# Patient Record
Sex: Male | Born: 1945 | Race: White | Hispanic: No | Marital: Married | Smoking: Never smoker
Health system: Southern US, Community
[De-identification: ages and names within clinical notes are randomized; demographics above are authoritative.]

## PROBLEM LIST (undated history)

## (undated) DIAGNOSIS — I10 Essential (primary) hypertension: Secondary | ICD-10-CM

## (undated) DIAGNOSIS — N183 Chronic kidney disease, stage 3 unspecified: Secondary | ICD-10-CM

## (undated) HISTORY — DX: Essential (primary) hypertension: I10

## (undated) HISTORY — DX: Chronic kidney disease, stage 3 unspecified: N18.30

---

## 2016-12-09 DIAGNOSIS — H903 Sensorineural hearing loss, bilateral: Secondary | ICD-10-CM | POA: Diagnosis not present

## 2016-12-09 DIAGNOSIS — H8102 Meniere's disease, left ear: Secondary | ICD-10-CM | POA: Diagnosis not present

## 2017-01-10 DIAGNOSIS — N183 Chronic kidney disease, stage 3 (moderate): Secondary | ICD-10-CM | POA: Diagnosis not present

## 2017-01-18 DIAGNOSIS — N2581 Secondary hyperparathyroidism of renal origin: Secondary | ICD-10-CM | POA: Diagnosis not present

## 2017-01-18 DIAGNOSIS — R809 Proteinuria, unspecified: Secondary | ICD-10-CM | POA: Diagnosis not present

## 2017-01-18 DIAGNOSIS — N183 Chronic kidney disease, stage 3 (moderate): Secondary | ICD-10-CM | POA: Diagnosis not present

## 2017-01-18 DIAGNOSIS — E870 Hyperosmolality and hypernatremia: Secondary | ICD-10-CM | POA: Diagnosis not present

## 2017-07-10 DIAGNOSIS — N183 Chronic kidney disease, stage 3 (moderate): Secondary | ICD-10-CM | POA: Diagnosis not present

## 2017-07-14 DIAGNOSIS — Z0000 Encounter for general adult medical examination without abnormal findings: Secondary | ICD-10-CM | POA: Diagnosis not present

## 2017-07-20 DIAGNOSIS — E756 Lipid storage disorder, unspecified: Secondary | ICD-10-CM | POA: Diagnosis not present

## 2017-07-20 DIAGNOSIS — Z0000 Encounter for general adult medical examination without abnormal findings: Secondary | ICD-10-CM | POA: Diagnosis not present

## 2017-07-20 DIAGNOSIS — Z125 Encounter for screening for malignant neoplasm of prostate: Secondary | ICD-10-CM | POA: Diagnosis not present

## 2017-07-24 DIAGNOSIS — H903 Sensorineural hearing loss, bilateral: Secondary | ICD-10-CM | POA: Diagnosis not present

## 2017-07-24 DIAGNOSIS — H8102 Meniere's disease, left ear: Secondary | ICD-10-CM | POA: Diagnosis not present

## 2017-07-31 DIAGNOSIS — N183 Chronic kidney disease, stage 3 (moderate): Secondary | ICD-10-CM | POA: Diagnosis not present

## 2017-07-31 DIAGNOSIS — R768 Other specified abnormal immunological findings in serum: Secondary | ICD-10-CM | POA: Diagnosis not present

## 2017-07-31 DIAGNOSIS — E211 Secondary hyperparathyroidism, not elsewhere classified: Secondary | ICD-10-CM | POA: Diagnosis not present

## 2017-07-31 DIAGNOSIS — I10 Essential (primary) hypertension: Secondary | ICD-10-CM | POA: Diagnosis not present

## 2017-07-31 DIAGNOSIS — R809 Proteinuria, unspecified: Secondary | ICD-10-CM | POA: Diagnosis not present

## 2017-09-19 DIAGNOSIS — I10 Essential (primary) hypertension: Secondary | ICD-10-CM | POA: Diagnosis not present

## 2017-09-19 DIAGNOSIS — Z23 Encounter for immunization: Secondary | ICD-10-CM | POA: Diagnosis not present

## 2017-12-12 DIAGNOSIS — H8102 Meniere's disease, left ear: Secondary | ICD-10-CM | POA: Diagnosis not present

## 2017-12-12 DIAGNOSIS — H903 Sensorineural hearing loss, bilateral: Secondary | ICD-10-CM | POA: Diagnosis not present

## 2017-12-21 DIAGNOSIS — N401 Enlarged prostate with lower urinary tract symptoms: Secondary | ICD-10-CM | POA: Diagnosis not present

## 2017-12-21 DIAGNOSIS — Z0000 Encounter for general adult medical examination without abnormal findings: Secondary | ICD-10-CM | POA: Diagnosis not present

## 2017-12-21 DIAGNOSIS — M109 Gout, unspecified: Secondary | ICD-10-CM | POA: Diagnosis not present

## 2017-12-21 DIAGNOSIS — I10 Essential (primary) hypertension: Secondary | ICD-10-CM | POA: Diagnosis not present

## 2017-12-21 DIAGNOSIS — N183 Chronic kidney disease, stage 3 (moderate): Secondary | ICD-10-CM | POA: Diagnosis not present

## 2017-12-21 DIAGNOSIS — H8109 Meniere's disease, unspecified ear: Secondary | ICD-10-CM | POA: Diagnosis not present

## 2018-01-08 DIAGNOSIS — I10 Essential (primary) hypertension: Secondary | ICD-10-CM | POA: Diagnosis not present

## 2018-01-09 DIAGNOSIS — E211 Secondary hyperparathyroidism, not elsewhere classified: Secondary | ICD-10-CM | POA: Diagnosis not present

## 2018-01-09 DIAGNOSIS — R809 Proteinuria, unspecified: Secondary | ICD-10-CM | POA: Diagnosis not present

## 2018-01-09 DIAGNOSIS — N183 Chronic kidney disease, stage 3 (moderate): Secondary | ICD-10-CM | POA: Diagnosis not present

## 2018-01-09 DIAGNOSIS — I10 Essential (primary) hypertension: Secondary | ICD-10-CM | POA: Diagnosis not present

## 2018-01-09 DIAGNOSIS — R768 Other specified abnormal immunological findings in serum: Secondary | ICD-10-CM | POA: Diagnosis not present

## 2018-01-11 DIAGNOSIS — H903 Sensorineural hearing loss, bilateral: Secondary | ICD-10-CM | POA: Diagnosis not present

## 2018-10-01 DIAGNOSIS — E785 Hyperlipidemia, unspecified: Secondary | ICD-10-CM | POA: Diagnosis not present

## 2018-10-01 DIAGNOSIS — M109 Gout, unspecified: Secondary | ICD-10-CM | POA: Diagnosis not present

## 2018-10-01 DIAGNOSIS — I10 Essential (primary) hypertension: Secondary | ICD-10-CM | POA: Diagnosis not present

## 2018-10-01 DIAGNOSIS — N401 Enlarged prostate with lower urinary tract symptoms: Secondary | ICD-10-CM | POA: Diagnosis not present

## 2018-10-01 DIAGNOSIS — N183 Chronic kidney disease, stage 3 (moderate): Secondary | ICD-10-CM | POA: Diagnosis not present

## 2018-10-01 DIAGNOSIS — Z23 Encounter for immunization: Secondary | ICD-10-CM | POA: Diagnosis not present

## 2018-10-01 DIAGNOSIS — N138 Other obstructive and reflux uropathy: Secondary | ICD-10-CM | POA: Diagnosis not present

## 2018-10-01 LAB — CBC AND DIFFERENTIAL
HCT: 47 (ref 41–53)
Hemoglobin: 16 (ref 13.5–17.5)
WBC: 7.7

## 2018-10-01 LAB — HEPATIC FUNCTION PANEL
ALT: 37 (ref 10–40)
AST: 27 (ref 14–40)
Alkaline Phosphatase: 61 (ref 25–125)
Bilirubin, Total: 0.8

## 2018-10-01 LAB — BASIC METABOLIC PANEL
BUN: 24 — AB (ref 4–21)
CO2: 27 — AB (ref 13–22)
Chloride: 101 (ref 99–108)
Creatinine: 1.3 (ref 0.6–1.3)
Glucose: 117
Potassium: 3.8 (ref 3.4–5.3)
Sodium: 138 (ref 137–147)

## 2018-10-01 LAB — CBC: RBC: 5.25 — AB (ref 3.87–5.11)

## 2018-10-01 LAB — LIPID PANEL
Cholesterol: 180 (ref 0–200)
LDL Cholesterol: 74
Triglycerides: 268 — AB (ref 40–160)

## 2018-10-01 LAB — COMPREHENSIVE METABOLIC PANEL
Albumin: 4.6 (ref 3.5–5.0)
Calcium: 9.8 (ref 8.7–10.7)
GFR calc Af Amer: 64
GFR calc non Af Amer: 53

## 2019-01-03 DIAGNOSIS — I10 Essential (primary) hypertension: Secondary | ICD-10-CM | POA: Diagnosis not present

## 2019-01-03 DIAGNOSIS — Z125 Encounter for screening for malignant neoplasm of prostate: Secondary | ICD-10-CM | POA: Diagnosis not present

## 2019-01-03 DIAGNOSIS — N401 Enlarged prostate with lower urinary tract symptoms: Secondary | ICD-10-CM | POA: Diagnosis not present

## 2019-01-03 DIAGNOSIS — E785 Hyperlipidemia, unspecified: Secondary | ICD-10-CM | POA: Diagnosis not present

## 2019-01-03 DIAGNOSIS — N183 Chronic kidney disease, stage 3 (moderate): Secondary | ICD-10-CM | POA: Diagnosis not present

## 2019-01-03 DIAGNOSIS — M109 Gout, unspecified: Secondary | ICD-10-CM | POA: Diagnosis not present

## 2019-01-03 LAB — CBC AND DIFFERENTIAL
HCT: 47 (ref 41–53)
Hemoglobin: 15.7 (ref 13.5–17.5)
Platelets: 137 — AB (ref 150–399)
WBC: 7.3

## 2019-01-03 LAB — CBC: RBC: 5.26 — AB (ref 3.87–5.11)

## 2019-01-04 LAB — BASIC METABOLIC PANEL
BUN: 28 — AB (ref 4–21)
CO2: 32 — AB (ref 13–22)
Chloride: 101 (ref 99–108)
Creatinine: 1.4 — AB (ref 0.6–1.3)
Glucose: 90
Potassium: 4.4 (ref 3.4–5.3)
Sodium: 142 (ref 137–147)

## 2019-01-04 LAB — LIPID PANEL
Cholesterol: 158 (ref 0–200)
HDL: 106 — AB (ref 35–70)
LDL Cholesterol: 74
Triglycerides: 158 (ref 40–160)

## 2019-01-04 LAB — HEPATIC FUNCTION PANEL
ALT: 42 — AB (ref 10–40)
AST: 28 (ref 14–40)
Alkaline Phosphatase: 59 (ref 25–125)
Bilirubin, Total: 0.6

## 2019-01-04 LAB — COMPREHENSIVE METABOLIC PANEL
Albumin: 4.5 (ref 3.5–5.0)
Calcium: 9.7 (ref 8.7–10.7)
GFR calc Af Amer: 59
GFR calc non Af Amer: 49

## 2019-01-15 DIAGNOSIS — Z0000 Encounter for general adult medical examination without abnormal findings: Secondary | ICD-10-CM | POA: Diagnosis not present

## 2019-01-15 DIAGNOSIS — Z1389 Encounter for screening for other disorder: Secondary | ICD-10-CM | POA: Diagnosis not present

## 2019-04-01 DIAGNOSIS — M109 Gout, unspecified: Secondary | ICD-10-CM | POA: Diagnosis not present

## 2019-04-01 DIAGNOSIS — N400 Enlarged prostate without lower urinary tract symptoms: Secondary | ICD-10-CM | POA: Diagnosis not present

## 2019-04-01 DIAGNOSIS — N183 Chronic kidney disease, stage 3 (moderate): Secondary | ICD-10-CM | POA: Diagnosis not present

## 2019-04-01 DIAGNOSIS — I129 Hypertensive chronic kidney disease with stage 1 through stage 4 chronic kidney disease, or unspecified chronic kidney disease: Secondary | ICD-10-CM | POA: Diagnosis not present

## 2019-07-04 DIAGNOSIS — H903 Sensorineural hearing loss, bilateral: Secondary | ICD-10-CM | POA: Diagnosis not present

## 2019-07-04 DIAGNOSIS — D126 Benign neoplasm of colon, unspecified: Secondary | ICD-10-CM | POA: Diagnosis not present

## 2019-07-04 DIAGNOSIS — N183 Chronic kidney disease, stage 3 (moderate): Secondary | ICD-10-CM | POA: Diagnosis not present

## 2019-07-04 DIAGNOSIS — I10 Essential (primary) hypertension: Secondary | ICD-10-CM | POA: Diagnosis not present

## 2019-08-19 DIAGNOSIS — Z23 Encounter for immunization: Secondary | ICD-10-CM | POA: Diagnosis not present

## 2019-09-16 DIAGNOSIS — Z1159 Encounter for screening for other viral diseases: Secondary | ICD-10-CM | POA: Diagnosis not present

## 2019-09-19 DIAGNOSIS — D122 Benign neoplasm of ascending colon: Secondary | ICD-10-CM | POA: Diagnosis not present

## 2019-09-19 DIAGNOSIS — D123 Benign neoplasm of transverse colon: Secondary | ICD-10-CM | POA: Diagnosis not present

## 2019-09-19 DIAGNOSIS — Z86010 Personal history of colonic polyps: Secondary | ICD-10-CM | POA: Diagnosis not present

## 2019-09-24 DIAGNOSIS — D123 Benign neoplasm of transverse colon: Secondary | ICD-10-CM | POA: Diagnosis not present

## 2019-09-24 DIAGNOSIS — D122 Benign neoplasm of ascending colon: Secondary | ICD-10-CM | POA: Diagnosis not present

## 2019-12-24 DIAGNOSIS — N183 Chronic kidney disease, stage 3 unspecified: Secondary | ICD-10-CM | POA: Diagnosis not present

## 2019-12-29 ENCOUNTER — Ambulatory Visit: Payer: PRIVATE HEALTH INSURANCE | Attending: Internal Medicine

## 2019-12-29 DIAGNOSIS — Z23 Encounter for immunization: Secondary | ICD-10-CM | POA: Insufficient documentation

## 2019-12-29 NOTE — Progress Notes (Signed)
   Covid-19 Vaccination Clinic  Name:  Andre [name]    MRN: [id number] DOB: 1946-07-13  12/29/2019  Andre [name] was observed post Covid-19 immunization for 15 minutes without incidence. He was provided with Vaccine Information Sheet and instruction to access the V-Safe system.   Andre [name] was instructed to call 911 with any severe reactions post vaccine: Marland Kitchen Difficulty breathing  . Swelling of your face and throat  . A fast heartbeat  . A bad rash all over your body  . Dizziness and weakness    Immunizations Administered    Name Date Dose VIS Date Route   Pfizer COVID-19 Vaccine 12/29/2019 12:51 PM 0.3 mL 11/15/2019 Intramuscular   Manufacturer: [place]   Lot: [id number]   [place]: [id number]

## 2019-12-31 DIAGNOSIS — M109 Gout, unspecified: Secondary | ICD-10-CM | POA: Diagnosis not present

## 2019-12-31 DIAGNOSIS — R809 Proteinuria, unspecified: Secondary | ICD-10-CM | POA: Diagnosis not present

## 2019-12-31 DIAGNOSIS — I129 Hypertensive chronic kidney disease with stage 1 through stage 4 chronic kidney disease, or unspecified chronic kidney disease: Secondary | ICD-10-CM | POA: Diagnosis not present

## 2019-12-31 DIAGNOSIS — N183 Chronic kidney disease, stage 3 unspecified: Secondary | ICD-10-CM | POA: Diagnosis not present

## 2019-12-31 DIAGNOSIS — N400 Enlarged prostate without lower urinary tract symptoms: Secondary | ICD-10-CM | POA: Diagnosis not present

## 2020-01-05 ENCOUNTER — Ambulatory Visit: Payer: Self-pay

## 2020-01-10 ENCOUNTER — Ambulatory Visit: Payer: Self-pay

## 2020-01-17 DIAGNOSIS — H903 Sensorineural hearing loss, bilateral: Secondary | ICD-10-CM | POA: Diagnosis not present

## 2020-01-17 DIAGNOSIS — Z86010 Personal history of colonic polyps: Secondary | ICD-10-CM | POA: Diagnosis not present

## 2020-01-17 DIAGNOSIS — Z0000 Encounter for general adult medical examination without abnormal findings: Secondary | ICD-10-CM | POA: Diagnosis not present

## 2020-01-17 DIAGNOSIS — M109 Gout, unspecified: Secondary | ICD-10-CM | POA: Diagnosis not present

## 2020-01-17 DIAGNOSIS — Z1389 Encounter for screening for other disorder: Secondary | ICD-10-CM | POA: Diagnosis not present

## 2020-01-17 DIAGNOSIS — E785 Hyperlipidemia, unspecified: Secondary | ICD-10-CM | POA: Diagnosis not present

## 2020-01-17 DIAGNOSIS — Z125 Encounter for screening for malignant neoplasm of prostate: Secondary | ICD-10-CM | POA: Diagnosis not present

## 2020-01-17 DIAGNOSIS — N183 Chronic kidney disease, stage 3 unspecified: Secondary | ICD-10-CM | POA: Diagnosis not present

## 2020-01-17 DIAGNOSIS — I10 Essential (primary) hypertension: Secondary | ICD-10-CM | POA: Diagnosis not present

## 2020-01-17 LAB — LIPID PANEL
Cholesterol: 149 (ref 0–200)
HDL: 100 — AB (ref 35–70)
LDL Cholesterol: 75
Triglycerides: 143 (ref 40–160)

## 2020-01-17 LAB — CBC: RBC: 5.36 — AB (ref 3.87–5.11)

## 2020-01-17 LAB — CBC AND DIFFERENTIAL
HCT: 48 (ref 41–53)
Hemoglobin: 16 (ref 13.5–17.5)
Platelets: 157 (ref 150–399)
WBC: 7.4

## 2020-01-20 ENCOUNTER — Ambulatory Visit: Payer: PRIVATE HEALTH INSURANCE | Attending: Internal Medicine

## 2020-01-20 DIAGNOSIS — Z23 Encounter for immunization: Secondary | ICD-10-CM

## 2020-01-20 NOTE — Progress Notes (Signed)
   Covid-19 Vaccination Clinic  Name:  Andre [name]    MRN: [id number] DOB: 21-Jul-1946  01/20/2020  Mr. Ragucci was observed post Covid-19 immunization for 15 minutes without incidence. He was provided with Vaccine Information Sheet and instruction to access the V-Safe system.   Mr. Tschirhart was instructed to call 911 with any severe reactions post vaccine: Marland Kitchen Difficulty breathing  . Swelling of your face and throat  . A fast heartbeat  . A bad rash all over your body  . Dizziness and weakness    Immunizations Administered    Name Date Dose VIS Date Route   Pfizer COVID-19 Vaccine 01/20/2020  8:36 AM 0.3 mL 11/15/2019 Intramuscular   Manufacturer: [place]   Lot: [id number]   [place]: [id number]

## 2020-05-08 DIAGNOSIS — M109 Gout, unspecified: Secondary | ICD-10-CM | POA: Diagnosis not present

## 2020-05-08 DIAGNOSIS — N183 Chronic kidney disease, stage 3 unspecified: Secondary | ICD-10-CM | POA: Diagnosis not present

## 2020-07-15 DIAGNOSIS — I10 Essential (primary) hypertension: Secondary | ICD-10-CM | POA: Diagnosis not present

## 2020-07-15 DIAGNOSIS — N183 Chronic kidney disease, stage 3 unspecified: Secondary | ICD-10-CM | POA: Diagnosis not present

## 2020-07-15 DIAGNOSIS — M109 Gout, unspecified: Secondary | ICD-10-CM | POA: Diagnosis not present

## 2020-09-03 ENCOUNTER — Ambulatory Visit: Payer: Medicare Other | Attending: Internal Medicine

## 2020-09-03 DIAGNOSIS — Z23 Encounter for immunization: Secondary | ICD-10-CM

## 2020-09-03 NOTE — Progress Notes (Signed)
   Covid-19 Vaccination Clinic  Name:  Katie Moch    MRN: [id number] DOB: 1946-06-21  09/03/2020  Mr. Rakestraw was observed post Covid-19 immunization for 15 minutes without incident. He was provided with Vaccine Information Sheet and instruction to access the V-Safe system.   Mr. Luten was instructed to call 911 with any severe reactions post vaccine: Marland Kitchen Difficulty breathing  . Swelling of face and throat  . A fast heartbeat  . A bad rash all over body  . Dizziness and weakness

## 2020-09-14 DIAGNOSIS — Z23 Encounter for immunization: Secondary | ICD-10-CM | POA: Diagnosis not present

## 2020-09-30 ENCOUNTER — Ambulatory Visit: Payer: PRIVATE HEALTH INSURANCE

## 2020-10-26 ENCOUNTER — Ambulatory Visit (INDEPENDENT_AMBULATORY_CARE_PROVIDER_SITE_OTHER): Payer: Medicare Other | Admitting: Internal Medicine

## 2020-10-26 ENCOUNTER — Other Ambulatory Visit: Payer: Self-pay

## 2020-10-26 ENCOUNTER — Encounter: Payer: Self-pay | Admitting: Internal Medicine

## 2020-10-26 VITALS — BP 118/62 | HR 86 | Temp 98.0°F | Ht 68.5 in | Wt 201.8 lb

## 2020-10-26 DIAGNOSIS — N138 Other obstructive and reflux uropathy: Secondary | ICD-10-CM | POA: Diagnosis not present

## 2020-10-26 DIAGNOSIS — Z1159 Encounter for screening for other viral diseases: Secondary | ICD-10-CM

## 2020-10-26 DIAGNOSIS — H8109 Meniere's disease, unspecified ear: Secondary | ICD-10-CM

## 2020-10-26 DIAGNOSIS — E785 Hyperlipidemia, unspecified: Secondary | ICD-10-CM | POA: Diagnosis not present

## 2020-10-26 DIAGNOSIS — I10 Essential (primary) hypertension: Secondary | ICD-10-CM | POA: Diagnosis not present

## 2020-10-26 DIAGNOSIS — N183 Chronic kidney disease, stage 3 unspecified: Secondary | ICD-10-CM | POA: Insufficient documentation

## 2020-10-26 DIAGNOSIS — M109 Gout, unspecified: Secondary | ICD-10-CM | POA: Insufficient documentation

## 2020-10-26 DIAGNOSIS — D126 Benign neoplasm of colon, unspecified: Secondary | ICD-10-CM | POA: Diagnosis not present

## 2020-10-26 DIAGNOSIS — N401 Enlarged prostate with lower urinary tract symptoms: Secondary | ICD-10-CM | POA: Insufficient documentation

## 2020-10-26 MED ORDER — INDOMETHACIN 50 MG PO CAPS: 50.0000 mg | ORAL_CAPSULE | Freq: Once | ORAL | 0 refills | Status: DC | PRN

## 2020-10-26 NOTE — Progress Notes (Signed)
Provider:  Rexene Edison. Mariea Clonts, D.O., C.M.D. Location:   [facility]   Place of Service:    clinic Previous PCP: Gayland Curry, DO Patient Care Team: Gayland Curry, DO as PCP - General (Geriatric Medicine)  Extended Emergency Contact Information Primary Emergency Contact: Jerrald, [place] Mobile Phone: [phone] Relation: Spouse Secondary Emergency Contact: Trena Platt Mobile Phone: [phone] Relation: Daughter  Code Status: DNR Goals of Care: Advanced Directive information Advanced Directives 10/26/2020  Does Patient Have a Medical Advance Directive? Yes  Type of Advance Directive Out of facility DNR (pink MOST or yellow form)  Does patient want to make changes to medical advance directive? No - Patient declined      Chief Complaint  Patient presents with  . Establish Care    New patient to establish care   . Health Maintenance    Hepatitis C screening, PNA patient states he really does not know but maybe 5 years ago and Tetanus/ T Dap   . Acute Visit    Discuss gout medication     HPI: Patient is a 74 y.o. male seen today to establish with [facility].  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  Speech recognition is challenging. Had been followed by a cochlear implant specialist at [facility]  At that time, he was not a candidate.   His hearing is a problem for him and a bigger one for his wife.  Got new hearing aids b/c those installed at [facility] were ineffective.  New ones were from [employer].    They have a cat, as well.  Burmese dark champagne color.  He's from [facility].  His wife established here a few weeks ago.    He's being followed for kidney disease--Dr. Royce Macadamia at [facility] (due early next year to go again).    He's had gout attacks.  It's affected by his diet.  Seems to get worse if he eats too much bread.  A glass of wine or scotch does ok, but if not in moderation, he has trouble.  He  had been on indomethacin, but his [facility] physician advised against it.  He has prn prednisone in the event of a flare-up.  indomethacin would knock it out right away if he had a flare.    He's used a small amount of ibuprofen for aches here and there.    He had BPH with LUTS.  He gets up just once at night.  Slight delay in stream, but nothing serious.    He has been taking the pravastatin.  LDL was good when he had it checked in Feb.    He had three tubular adenomas--Dr. Senaida Ores had said another one in 5 yrs.    Sees well.  Has not had an eye test in a long time.   His wife wanted him to get the shingles shot and he had that.  He thinks he had a pneumonia shot then.   It's been years since tetanus vaccine.    He seems to have developed a little bit of allergies.    He does walking and they did a lot of gardening at their old home in [place] before moving to [facility].    He has a living will, hcpoa and DNR.  Past Medical History:  Diagnosis Date  . CKD (chronic kidney disease), stage III ([place])   . HTN (hypertension)    History reviewed. No pertinent surgical history.  Social History   Socioeconomic History  . Marital status: Married    Spouse name: Not on file  . Number of children: Not on file  . Years of education: Not on file  . Highest education level: Master's degree (e.g., MA, MS, MEng, MEd, MSW, MBA)  Occupational History  . Not on file  Tobacco Use  . Smoking status: Never Smoker  . Smokeless tobacco: Never Used  Substance and Sexual Activity  . Alcohol use: Yes    Comment: Rarely   . Drug use: Never  . Sexual activity: Yes  Other Topics Concern  . Not on file  Social History Narrative   Diet: Regular      Do you drink/ eat things with caffeine?  Diet Soda / Coffee      Marital status:    Married                           What year were you married ? 1970      Do you live in a house, apartment,assistred living, condo, trailer, etc.)?  [facility] /  Independent      Is it one or more stories? 1      How many persons live in your home ? 2      Do you have any pets in your home ?(please list)  1 Cat      Highest Level of education completed:  MBA      Current or past profession:  Geneticist, molecular, Contractor / [place]      Do you exercise? Yes                             Type & how often  Walk / Swim      ADVANCED DIRECTIVES (Please bring copies)      Do you have a living will?  Yes      Do you have a DNR form?     Yes                  If not, do you want to discuss one?       Do you have signed POA?HPOA forms?    Yes             If so, please bring to your appointment      FUNCTIONAL STATUS- To be completed by Spouse / child / Staff       Do you have difficulty bathing or dressing yourself ? No      Do you have difficulty preparing food or eating ? No      Do you have difficulty managing your mediation ? No      Do you have difficulty managing your finances ? No      Do you have difficulty affording your medication ? No      Social Determinants of Health   Financial Resource Strain:   . Difficulty of Paying Living Expenses: Not on file  Food Insecurity:   . Worried About Charity fundraiser in the Last Year: Not on file  . Ran Out of Food in the Last Year: Not on file  Transportation Needs:   . Lack of Transportation (Medical): Not on file  . Lack of Transportation (Non-Medical): Not on file  Physical Activity:   . Days of Exercise per Week: Not on file  . Minutes  of Exercise per Session: Not on file  Stress:   . Feeling of Stress : Not on file  Social Connections:   . Frequency of Communication with Friends and Family: Not on file  . Frequency of Social Gatherings with Friends and Family: Not on file  . Attends Religious Services: Not on file  . Active Member of Clubs or Organizations: Not on file  . Attends Archivist Meetings: Not on file  . Marital Status: Not on file    reports  that he has never smoked. He has never used smokeless tobacco. He reports current alcohol use. He reports that he does not use drugs.  Functional Status Survey:    Family History  Problem Relation Age of Onset  . Stroke Mother 72  . Bronchiolitis Father 73    Health Maintenance  Topic Date Due  . Hepatitis C Screening  Never done  . TETANUS/TDAP  Never done  . PNA vac Low Risk Adult (1 of 2 - PCV13) Never done  . COLONOSCOPY  01/05/2029  . INFLUENZA VACCINE  Completed  . COVID-19 Vaccine  Completed    Allergies  Allergen Reactions  . Colchicine     Upset stomach     Outpatient Encounter Medications as of 10/26/2020  Medication Sig  . allopurinol (ZYLOPRIM) 100 MG tablet Take 100 mg by mouth daily.  . chlorthalidone (HYGROTON) 25 MG tablet Take 12.5 mg by mouth daily.  Marland Kitchen FLUTICASONE PROPIONATE NA Place into the nose daily.  . irbesartan (AVAPRO) 75 MG tablet Take 75 mg by mouth daily.  . pravastatin (PRAVACHOL) 20 MG tablet Take 20 mg by mouth daily.  . predniSONE (DELTASONE) 10 MG tablet Take 10 mg by mouth as needed.  . tamsulosin (FLOMAX) 0.4 MG CAPS capsule Take 0.4 mg by mouth at bedtime.  . Vitamin D, Ergocalciferol, 50 MCG (2000 UT) CAPS Take by mouth daily.   No facility-administered encounter medications on file as of 10/26/2020.    Review of Systems  Constitutional: Negative for chills, fever and malaise/fatigue.  HENT: Positive for hearing loss.   Eyes: Negative for blurred vision.       No recent eye exam--recommended one  Respiratory: Negative for cough and shortness of breath.   Cardiovascular: Negative for chest pain, palpitations and leg swelling.  Gastrointestinal: Negative for abdominal pain, blood in stool, constipation, diarrhea, heartburn and melena.  Genitourinary: Negative for dysuria.  Musculoskeletal: Positive for joint pain. Negative for back pain, falls, myalgias and neck pain.       Pain in feet  Skin: Negative for itching and rash.   Neurological: Negative for dizziness, loss of consciousness and weakness.  Endo/Heme/Allergies: Positive for environmental allergies. Does not bruise/bleed easily.  Psychiatric/Behavioral: Negative for depression and memory loss. The patient is not nervous/anxious and does not have insomnia.     Vitals:   10/26/20 1328  BP: 118/62  Pulse: 86  Temp: 98 F (36.7 C)  TempSrc: Temporal  SpO2: 96%  Weight: 201 lb 12.8 oz (91.5 kg)  Height: 5' 8.5" (1.74 m)   Body mass index is 30.23 kg/m. Physical Exam Vitals reviewed.  Constitutional:      General: He is not in acute distress.    Appearance: Normal appearance. He is not ill-appearing or toxic-appearing.  HENT:     Head: Normocephalic and atraumatic.     Right Ear: Tympanic membrane, ear canal and external ear normal.     Left Ear: Tympanic membrane, ear canal and external ear  normal.     Ears:     Comments: HOH, right hearing aid    Nose: No congestion.     Mouth/Throat:     Pharynx: Oropharynx is clear.  Eyes:     Extraocular Movements: Extraocular movements intact.     Conjunctiva/sclera: Conjunctivae normal.     Pupils: Pupils are equal, round, and reactive to light.     Comments: Arcus senilensis  Cardiovascular:     Rate and Rhythm: Normal rate and regular rhythm.     Pulses: Normal pulses.     Heart sounds: Normal heart sounds.  Pulmonary:     Effort: Pulmonary effort is normal.     Breath sounds: Normal breath sounds. No wheezing, rhonchi or rales.  Abdominal:     General: Bowel sounds are normal. There is no distension.     Palpations: Abdomen is soft.     Tenderness: There is no abdominal tenderness. There is no guarding or rebound.  Musculoskeletal:        General: Normal range of motion.     Cervical back: Neck supple.     Right lower leg: No edema.     Left lower leg: No edema.  Lymphadenopathy:     Cervical: No cervical adenopathy.  Skin:    General: Skin is warm and dry.     Capillary Refill:  Capillary refill takes less than 2 seconds.  Neurological:     General: No focal deficit present.     Mental Status: He is alert and oriented to person, place, and time.     Cranial Nerves: No cranial nerve deficit.     Sensory: No sensory deficit.     Motor: No weakness.     Coordination: Coordination normal.     Gait: Gait normal.     Deep Tendon Reflexes: Reflexes normal.  Psychiatric:        Mood and Affect: Mood normal.        Behavior: Behavior normal.        Thought Content: Thought content normal.        Judgment: Judgment normal.     Labs reviewed: Basic Metabolic Panel: No results for input(s): NA, K, CL, CO2, GLUCOSE, BUN, CREATININE, CALCIUM, MG, PHOS in the last 8760 hours. Liver Function Tests: No results for input(s): AST, ALT, ALKPHOS, BILITOT, PROT, ALBUMIN in the last 8760 hours. No results for input(s): LIPASE, AMYLASE in the last 8760 hours. No results for input(s): AMMONIA in the last 8760 hours. CBC: Recent Labs    01/17/20 0000  WBC 7.4  HGB 16.0  HCT 48  PLT 157   Cardiac Enzymes: No results for input(s): CKTOTAL, CKMB, CKMBINDEX, TROPONINI in the last 8760 hours. BNP: Invalid input(s): POCBNP No results found for: HGBA1C No results found for: TSH No results found for: VITAMINB12 No results found for: FOLATE No results found for: IRON, TIBC, FERRITIN  Imaging and Procedures noted on new patient packet: None included  Assessment/Plan 1. Meniere's disease, unspecified laterality - does not have any vertigo, dizziness, only hearing loss and comprehension, tinnitus - refer to new audiologist here--has not been since he was in [place] and saw Dr at [facility] - Ambulatory referral to Audiology  2. Tubular adenoma of colon -next due for cscope in 2023--he really does not want to do one of those again  3. Gouty arthritis - check uric acid - discussed his acute gout tx--he does not want to take prednisone and can usually get rid of a  flare with a single  indomethacin tablet - Basic metabolic panel; Future - Uric Acid; Future  4. Benign essential HTN -bp at goal, no change needed  5. Stage 3 chronic kidney disease, unspecified whether stage 3a or 3b CKD ([place]) -cont to follow with [facility] - Avoid nephrotoxic agents like nsaids, dose adjust renally excreted meds, hydrate--overall - CBC with Differential/Platelet; Future - Lipid panel; Future - Basic metabolic panel; Future - Hepatic function panel; Future - Hepatitis C antibody; Future  6. BPH with obstruction/lower urinary tract symptoms -mild symptoms, monitor  7. Hyperlipidemia, unspecified hyperlipidemia type -lipids were good with LDL 75 back in feb on pravachol so continue this - Lipid panel; Future  8. Encounter for hepatitis C screening test for low risk patient - Hepatitis C antibody; Future  Labs/tests ordered:   Lab Orders     CBC with Differential/Platelet     Lipid panel     Basic metabolic panel     Hepatic function panel     Hepatitis C antibody     Uric Acid All in AM here  F/u in 6 mos for med mgt Needs vaccine records from [facility] b/c shingles and pneumonia vaccines were not in [facility] records.  Fredy Gladu L. Mahayla Haddaway, D.O. [facility] [address], [place] [address] Cell Phone (Mon-Fri 8am-5pm):  [phone] On Call:  [phone] & follow prompts after 5pm & weekends Office Phone:  [phone] Office Fax:  [phone]

## 2020-10-27 ENCOUNTER — Other Ambulatory Visit: Payer: Medicare Other

## 2020-10-27 DIAGNOSIS — N183 Chronic kidney disease, stage 3 unspecified: Secondary | ICD-10-CM

## 2020-10-27 DIAGNOSIS — Z1159 Encounter for screening for other viral diseases: Secondary | ICD-10-CM | POA: Diagnosis not present

## 2020-10-27 DIAGNOSIS — E785 Hyperlipidemia, unspecified: Secondary | ICD-10-CM

## 2020-10-27 DIAGNOSIS — M109 Gout, unspecified: Secondary | ICD-10-CM | POA: Diagnosis not present

## 2020-10-28 NOTE — Progress Notes (Signed)
For some reason, I was thinking he was not on prevention medication for his gout when we met.  He clearly is on allopurinol.  This dose is not getting him to goal so perhaps we should try uloric 58m daily instead.  Hopefully, it will be covered by his insurance.  It's a newer agent with better results at lowering uric acid.  This is not urgent and can be addressed after the holiday.

## 2020-10-28 NOTE — Progress Notes (Signed)
Blood counts are normal (not anemic) Liver tests are normal Kidney function is above the same as last time but he appears better hydrated. Cholesterol is at goal Uric acid is elevated--ideally, to prevent gout flares, the level should be kept under 6.  I recommend he begin allopurinol 100mg  po daily for this.   Hepatitis C screen is pending

## 2020-10-30 LAB — HEPATIC FUNCTION PANEL
AG Ratio: 1.7 (calc) (ref 1.0–2.5)
ALT: 35 U/L (ref 9–46)
AST: 21 U/L (ref 10–35)
Albumin: 4.2 g/dL (ref 3.6–5.1)
Alkaline phosphatase (APISO): 80 U/L (ref 35–144)
Bilirubin, Direct: 0.2 mg/dL (ref 0.0–0.2)
Globulin: 2.5 g/dL (calc) (ref 1.9–3.7)
Indirect Bilirubin: 0.6 mg/dL (calc) (ref 0.2–1.2)
Total Bilirubin: 0.8 mg/dL (ref 0.2–1.2)
Total Protein: 6.7 g/dL (ref 6.1–8.1)

## 2020-10-30 LAB — LIPID PANEL
Cholesterol: 162 mg/dL (ref <200)
HDL: 53 mg/dL (ref >=40)
LDL Cholesterol (Calc): 86 mg/dL (calc)
Non-HDL Cholesterol (Calc): 109 mg/dL (calc) (ref <130)
Total CHOL/HDL Ratio: 3.1 (calc) (ref <5.0)
Triglycerides: 129 mg/dL (ref <150)

## 2020-10-30 LAB — BASIC METABOLIC PANEL
BUN/Creatinine Ratio: 14 (calc) (ref 6–22)
BUN: 20 mg/dL (ref 7–25)
CO2: 30 mmol/L (ref 20–32)
Calcium: 9.5 mg/dL (ref 8.6–10.3)
Chloride: 103 mmol/L (ref 98–110)
Creat: 1.44 mg/dL — ABNORMAL HIGH (ref 0.70–1.18)
Glucose, Bld: 102 mg/dL — ABNORMAL HIGH (ref 65–99)
Potassium: 4.6 mmol/L (ref 3.5–5.3)
Sodium: 141 mmol/L (ref 135–146)

## 2020-10-30 LAB — CBC WITH DIFFERENTIAL/PLATELET
Absolute Monocytes: 438 cells/uL (ref 200–950)
Basophils Absolute: 30 cells/uL (ref 0–200)
Basophils Relative: 0.5 %
Eosinophils Absolute: 456 cells/uL (ref 15–500)
Eosinophils Relative: 7.6 %
HCT: 49.3 % (ref 38.5–50.0)
Hemoglobin: 16.3 g/dL (ref 13.2–17.1)
Lymphs Abs: 1458 cells/uL (ref 850–3900)
MCH: 29.3 pg (ref 27.0–33.0)
MCHC: 33.1 g/dL (ref 32.0–36.0)
MCV: 88.7 fL (ref 80.0–100.0)
MPV: 11.8 fL (ref 7.5–12.5)
Monocytes Relative: 7.3 %
Neutro Abs: 3618 cells/uL (ref 1500–7800)
Neutrophils Relative %: 60.3 %
Platelets: 162 10*3/uL (ref 140–400)
RBC: 5.56 10*6/uL (ref 4.20–5.80)
RDW: 12.6 % (ref 11.0–15.0)
Total Lymphocyte: 24.3 %
WBC: 6 10*3/uL (ref 3.8–10.8)

## 2020-10-30 LAB — URIC ACID: Uric Acid, Serum: 7.6 mg/dL (ref 4.0–8.0)

## 2020-10-30 LAB — HEPATITIS C ANTIBODY
Hepatitis C Ab: NONREACTIVE
SIGNAL TO CUT-OFF: 0.01 (ref <1.00)

## 2020-11-03 ENCOUNTER — Telehealth: Payer: Self-pay

## 2020-11-03 ENCOUNTER — Other Ambulatory Visit: Payer: Self-pay

## 2020-11-03 MED ORDER — FEBUXOSTAT 40 MG PO TABS
40.0000 mg | ORAL_TABLET | Freq: Every day | ORAL | 1 refills | Status: DC
Start: 2020-11-03 — End: 2020-11-23

## 2020-11-06 DIAGNOSIS — H903 Sensorineural hearing loss, bilateral: Secondary | ICD-10-CM | POA: Diagnosis not present

## 2020-11-23 ENCOUNTER — Other Ambulatory Visit: Payer: Self-pay | Admitting: *Deleted

## 2020-11-23 MED ORDER — FEBUXOSTAT 40 MG PO TABS
40.0000 mg | ORAL_TABLET | Freq: Every day | ORAL | 1 refills | Status: DC
Start: 2020-11-23 — End: 2020-12-30

## 2020-11-23 NOTE — Telephone Encounter (Signed)
Received refill Request from [facility].

## 2020-12-01 DIAGNOSIS — N183 Chronic kidney disease, stage 3 unspecified: Secondary | ICD-10-CM | POA: Diagnosis not present

## 2020-12-11 DIAGNOSIS — M109 Gout, unspecified: Secondary | ICD-10-CM | POA: Diagnosis not present

## 2020-12-11 DIAGNOSIS — N1831 Chronic kidney disease, stage 3a: Secondary | ICD-10-CM | POA: Diagnosis not present

## 2020-12-11 DIAGNOSIS — N400 Enlarged prostate without lower urinary tract symptoms: Secondary | ICD-10-CM | POA: Diagnosis not present

## 2020-12-11 DIAGNOSIS — I129 Hypertensive chronic kidney disease with stage 1 through stage 4 chronic kidney disease, or unspecified chronic kidney disease: Secondary | ICD-10-CM | POA: Diagnosis not present

## 2020-12-11 DIAGNOSIS — R809 Proteinuria, unspecified: Secondary | ICD-10-CM | POA: Diagnosis not present

## 2020-12-30 ENCOUNTER — Ambulatory Visit (INDEPENDENT_AMBULATORY_CARE_PROVIDER_SITE_OTHER): Payer: Medicare Other | Admitting: Family

## 2020-12-30 ENCOUNTER — Other Ambulatory Visit: Payer: Self-pay

## 2020-12-30 ENCOUNTER — Encounter: Payer: Self-pay | Admitting: Family

## 2020-12-30 VITALS — BP 100/60 | HR 66 | Temp 96.8°F | Resp 16 | Ht 68.5 in | Wt 207.4 lb

## 2020-12-30 DIAGNOSIS — M109 Gout, unspecified: Secondary | ICD-10-CM | POA: Diagnosis not present

## 2020-12-30 DIAGNOSIS — R197 Diarrhea, unspecified: Secondary | ICD-10-CM

## 2020-12-30 MED ORDER — ALLOPURINOL 100 MG PO TABS: 100.0000 mg | ORAL_TABLET | Freq: Every day | ORAL | 0 refills | Status: DC

## 2020-12-30 NOTE — Progress Notes (Signed)
Provider: Hoang Pettingill FNP-C  Gayland Curry, DO  Patient Care Team: Gayland Curry, DO as PCP - General (Geriatric Medicine)  Extended Emergency Contact Information Primary Emergency Contact: Wendie Agreste Address: [place], [place] [address] Johnnette Litter of [place] Phone: [phone] Relation: Spouse Secondary Emergency Contact: Reinaldo Raddle [place] of [place] Mobile Phone: [phone] Relation: Daughter  Code Status:  Full Code  Goals of care: Advanced Directive information Advanced Directives 12/30/2020  Does Patient Have a Medical Advance Directive? No  Type of Advance Directive -  Does patient want to make changes to medical advance directive? -  Would patient like information on creating a medical advance directive? No - Patient declined     Chief Complaint  Patient presents with  . Acute Visit    Reaction to medication change.    HPI:  Pt is a 75 y.o. male seen today for an acute visit for evaluation of reaction to medication.Dr.Reed switching Allopurinol to uloric started having diarrhea for the past week since starting or Urolic.Diarrhea is described as loose stool.Has had increase flatulence too.No bloating,cramping or abdominal pain.Has not not had diarrhea for many years until he started on new medication.left small toe tends to get red when he has gout.Not sure too whether shoes are getting tight. He is careful with his diet and drink wine.   Past Medical History:  Diagnosis Date  . CKD (chronic kidney disease), stage III ([place])   . HTN (hypertension)    History reviewed. No pertinent surgical history.  Allergies  Allergen Reactions  . Colchicine     Upset stomach     Outpatient Encounter Medications as of 12/30/2020  Medication Sig  . chlorthalidone (HYGROTON) 25 MG tablet Take 12.5 mg by mouth daily.  . febuxostat (ULORIC) 40 MG tablet Take 1 tablet (40 mg total) by mouth daily.  Marland Kitchen FLUTICASONE  PROPIONATE NA Place into the nose daily.  . indomethacin (INDOCIN) 50 MG capsule Take 1 capsule (50 mg total) by mouth once as needed for up to 1 dose (gout flare).  . irbesartan (AVAPRO) 75 MG tablet Take 75 mg by mouth daily.  . pravastatin (PRAVACHOL) 20 MG tablet Take 20 mg by mouth daily.  . tamsulosin (FLOMAX) 0.4 MG CAPS capsule Take 0.4 mg by mouth at bedtime.  . Vitamin D, Ergocalciferol, 50 MCG (2000 UT) CAPS Take by mouth daily.  . [DISCONTINUED] allopurinol (ZYLOPRIM) 100 MG tablet Take 100 mg by mouth daily.   No facility-administered encounter medications on file as of 12/30/2020.    Review of Systems  Constitutional: Negative for appetite change, chills and fatigue.  Respiratory: Negative for cough, chest tightness, shortness of breath and wheezing.   Cardiovascular: Negative for chest pain, palpitations and leg swelling.  Gastrointestinal: Negative for abdominal distention, abdominal pain, blood in stool, constipation, nausea, rectal pain and vomiting.  Musculoskeletal: Negative for arthralgias, gait problem, joint swelling and myalgias.  Skin: Negative for color change, pallor and rash.  Neurological: Negative for weakness and numbness.    Immunization History  Administered Date(s) Administered  . Influenza-Unspecified 09/05/2019, 09/22/2020  . PFIZER(Purple [place])SARS-COV-2 Vaccination 12/29/2019, 01/20/2020, 09/03/2020   Pertinent  Health Maintenance Due  Topic Date Due  . PNA vac Low Risk Adult (1 of 2 - PCV13) Never done  . COLONOSCOPY (Pts 45-49yrs Insurance coverage will need to be confirmed)  09/18/2029  . INFLUENZA VACCINE  Completed   Fall Risk  12/30/2020  10/26/2020  Falls in the past year? 0 0  Number falls in past yr: 0 0  Injury with Fall? 0 0   Functional Status Survey:    Vitals:   12/30/20 1500  BP: 100/60  Pulse: 66  Resp: 16  Temp: (!) 96.8 F (36 C)  SpO2: 96%  Weight: 207 lb 6.4 oz (94.1 kg)  Height: 5' 8.5" (1.74 m)   Body mass  index is 31.08 kg/m. Physical Exam Vitals reviewed.  Constitutional:      General: He is not in acute distress.    Appearance: He is obese. He is not ill-appearing.  HENT:     Head: Normocephalic.  Eyes:     General: No scleral icterus.       Right eye: No discharge.        Left eye: No discharge.     Extraocular Movements: Extraocular movements intact.     Conjunctiva/sclera: Conjunctivae normal.     Pupils: Pupils are equal, round, and reactive to light.  Cardiovascular:     Rate and Rhythm: Normal rate and regular rhythm.     Pulses: Normal pulses.     Heart sounds: Normal heart sounds. No murmur heard. No friction rub. No gallop.   Pulmonary:     Effort: Pulmonary effort is normal. No respiratory distress.     Breath sounds: Normal breath sounds. No wheezing, rhonchi or rales.  Chest:     Chest wall: No tenderness.  Abdominal:     General: Bowel sounds are normal. There is no distension.     Palpations: Abdomen is soft. There is no mass.     Tenderness: There is no abdominal tenderness. There is no right CVA tenderness, left CVA tenderness, guarding or rebound.  Musculoskeletal:        General: No swelling or tenderness. Normal range of motion.     Right lower leg: No edema.     Left lower leg: No edema.  Skin:    General: Skin is warm and dry.     Coloration: Skin is not pale.     Findings: No bruising, erythema or rash.  Neurological:     Mental Status: He is alert and oriented to person, place, and time.     Cranial Nerves: No cranial nerve deficit.     Sensory: No sensory deficit.     Motor: No weakness.     Gait: Gait normal.  Psychiatric:        Mood and Affect: Mood normal.        Behavior: Behavior normal.        Thought Content: Thought content normal.        Judgment: Judgment normal.     Labs reviewed: Recent Labs    10/27/20 0825  NA 141  K 4.6  CL 103  CO2 30  GLUCOSE 102*  BUN 20  CREATININE 1.44*  CALCIUM 9.5   Recent Labs     10/27/20 0825  AST 21  ALT 35  BILITOT 0.8  PROT 6.7   Recent Labs    01/17/20 0000 10/27/20 0825  WBC 7.4 6.0  NEUTROABS  --  3,618  HGB 16.0 16.3  HCT 48 49.3  MCV  --  88.7  PLT 157 162   No results found for: TSH No results found for: HGBA1C Lab Results  Component Value Date   CHOL 162 10/27/2020   HDL 53 10/27/2020   LDLCALC 86 10/27/2020   TRIG 129 10/27/2020  CHOLHDL 3.1 10/27/2020    Significant Diagnostic Results in last 30 days:  No results found.  Assessment/Plan 1. Diarrhea, unspecified type Reports loose stool since starting on uloric. - discontinue uloric  - may take OTC imodium if diarrhea worsen.  - Notify provider if symptoms worsen or fail to improve   2. Gouty arthritis - discontinue uloric  Restart Allopurinol 100 mg tablet daily   Family/ staff Communication: Reviewed plan of care with patient  Labs/tests ordered: None   Next Appointment: As needed if symptoms worsen or fail to improve.   Sandrea Hughs, NP

## 2020-12-30 NOTE — Patient Instructions (Signed)
-   Stop taking Uloric then restart Allopurinol 100 mg tablet daily  - Notify provider if symptoms worsen or fail to improve  - May take Over the counter imodium if diarrhea worsen and notify provider.

## 2021-01-25 ENCOUNTER — Encounter: Payer: Self-pay | Admitting: Internal Medicine

## 2021-04-09 DIAGNOSIS — Z23 Encounter for immunization: Secondary | ICD-10-CM | POA: Diagnosis not present

## 2021-04-23 ENCOUNTER — Other Ambulatory Visit: Payer: Self-pay | Admitting: *Deleted

## 2021-04-23 MED ORDER — ALLOPURINOL 100 MG PO TABS: 100.0000 mg | ORAL_TABLET | Freq: Every day | ORAL | 1 refills | Status: DC

## 2021-04-23 NOTE — Telephone Encounter (Signed)
Patient requested refill to be sent to [facility].

## 2021-04-26 ENCOUNTER — Ambulatory Visit: Payer: Medicare Other | Admitting: Internal Medicine

## 2021-04-28 ENCOUNTER — Ambulatory Visit (INDEPENDENT_AMBULATORY_CARE_PROVIDER_SITE_OTHER): Payer: Medicare Other | Admitting: Family Medicine

## 2021-04-28 ENCOUNTER — Encounter: Payer: Self-pay | Admitting: Family Medicine

## 2021-04-28 ENCOUNTER — Other Ambulatory Visit: Payer: Self-pay

## 2021-04-28 VITALS — BP 130/66 | HR 84 | Temp 96.6°F | Ht 68.5 in | Wt 200.8 lb

## 2021-04-28 DIAGNOSIS — I10 Essential (primary) hypertension: Secondary | ICD-10-CM

## 2021-04-28 DIAGNOSIS — N183 Chronic kidney disease, stage 3 unspecified: Secondary | ICD-10-CM

## 2021-04-28 DIAGNOSIS — E785 Hyperlipidemia, unspecified: Secondary | ICD-10-CM

## 2021-04-28 DIAGNOSIS — M109 Gout, unspecified: Secondary | ICD-10-CM

## 2021-04-28 NOTE — Progress Notes (Signed)
Provider:  Alain Honey, MD  Careteam: Patient Care Team: [name], MD as PCP - General (Family Medicine)  PLACE OF SERVICE:  [place]  Advanced Directive information    Allergies  Allergen Reactions  . Colchicine     Upset stomach     Chief Complaint  Patient presents with  . Medical Management of Chronic Issues    Patient presents today for 6 month follow-up for HTN,HLD and gout. He reports concerns about his gout medication indomethacin.     HPI: Patient is a 75 y.o. male he moved to [place] from [place] several years ago and recently moved from private home to a cottage at [employer].  Generally he is healthy he does have some hypertension and elevated lipids as well as an occasional flareup of gout. He has no specific complaints today but is just here for routine follow-up. Review of his labs from 6 months ago shows LDL to be at target renal function shows some slight decline in lung function and PSA was normal.  Review of Systems:  Review of Systems  All other systems reviewed and are negative.   Past Medical History:  Diagnosis Date  . CKD (chronic kidney disease), stage III ([place])   . HTN (hypertension)    History reviewed. No pertinent surgical history. Social History:   reports that he has never smoked. He has never used smokeless tobacco. He reports current alcohol use. He reports that he does not use drugs.  Family History  Problem Relation Age of Onset  . Stroke Mother 63  . Bronchiolitis Father 70    Medications: Patient's Medications  New Prescriptions   No medications on file  Previous Medications   ALLOPURINOL (ZYLOPRIM) 100 MG TABLET    Take 1 tablet (100 mg total) by mouth daily.   CHLORTHALIDONE (HYGROTON) 25 MG TABLET    Take 12.5 mg by mouth daily.   FLUTICASONE PROPIONATE NA    Place into the nose daily.   INDOMETHACIN (INDOCIN) 50 MG CAPSULE    Take 1 capsule (50 mg total) by mouth once as needed  for up to 1 dose (gout flare).   IRBESARTAN (AVAPRO) 75 MG TABLET    Take 75 mg by mouth daily.   PRAVASTATIN (PRAVACHOL) 20 MG TABLET    Take 20 mg by mouth daily.   TAMSULOSIN (FLOMAX) 0.4 MG CAPS CAPSULE    Take 0.4 mg by mouth at bedtime.   VITAMIN D, ERGOCALCIFEROL, 50 MCG (2000 UT) CAPS    Take by mouth daily.  Modified Medications   No medications on file  Discontinued Medications   No medications on file    Physical Exam:  Vitals:   04/28/21 0902  BP: 130/66  Pulse: 84  Temp: (!) 96.6 F (35.9 C)  TempSrc: Temporal  SpO2: 98%  Weight: 200 lb 12.8 oz (91.1 kg)  Height: 5' 8.5" (1.74 m)   Body mass index is 30.09 kg/m. Wt Readings from Last 3 Encounters:  04/28/21 200 lb 12.8 oz (91.1 kg)  12/30/20 207 lb 6.4 oz (94.1 kg)  10/26/20 201 lb 12.8 oz (91.5 kg)    Physical Exam Vitals and nursing note reviewed.  Constitutional:      Appearance: Normal appearance. He is normal weight.  HENT:     Head: Normocephalic.  Cardiovascular:     Rate and Rhythm: Normal rate and regular rhythm.     Pulses: Normal pulses.  Pulmonary:     Effort: Pulmonary  effort is normal.     Breath sounds: Normal breath sounds.  Abdominal:     General: Abdomen is flat.     Palpations: Abdomen is soft.  Musculoskeletal:        General: Normal range of motion.     Cervical back: Normal range of motion.  Skin:    General: Skin is warm.     Findings: No lesion.  Neurological:     General: No focal deficit present.     Mental Status: He is alert and oriented to person, place, and time.  Psychiatric:        Mood and Affect: Mood normal.        Behavior: Behavior normal.        Thought Content: Thought content normal.     Labs reviewed: Basic Metabolic Panel: Recent Labs    10/27/20 0825  NA 141  K 4.6  CL 103  CO2 30  GLUCOSE 102*  BUN 20  CREATININE 1.44*  CALCIUM 9.5   Liver Function Tests: Recent Labs    10/27/20 0825  AST 21  ALT 35  BILITOT 0.8  PROT 6.7    No results for input(s): LIPASE, AMYLASE in the last 8760 hours. No results for input(s): AMMONIA in the last 8760 hours. CBC: Recent Labs    10/27/20 0825  WBC 6.0  NEUTROABS 3,618  HGB 16.3  HCT 49.3  MCV 88.7  PLT 162   Lipid Panel: Recent Labs    10/27/20 0825  CHOL 162  HDL 53  LDLCALC 86  TRIG 129  CHOLHDL 3.1   TSH: No results for input(s): TSH in the last 8760 hours. A1C: No results found for: HGBA1C   Assessment/Plan  1. Benign essential HTN Blood pressure well controlled on current dose of irbesartan, 75 mg and chlorthalidone 12.5  2. Gouty arthritis Has some occasional twinges of pain in big toe and knee but has not really had a flare for 3 to 4 years by history.  He does take low-dose allopurinol.  I do not know what uric acid levels have shown  3. Stage 3 chronic kidney disease, unspecified whether stage 3a or 3b CKD ([place]) Encouraged hydration.  We talked about discontinuation of NSAIDs as well as Indocin if gout should flare  4. Hyperlipidemia, unspecified hyperlipidemia type LDL is at target and statin is tolerated well.  We will recheck labs in 6 months   Alain Honey, MD [place] [phone]

## 2021-07-02 DIAGNOSIS — H6122 Impacted cerumen, left ear: Secondary | ICD-10-CM | POA: Diagnosis not present

## 2021-07-02 DIAGNOSIS — H903 Sensorineural hearing loss, bilateral: Secondary | ICD-10-CM | POA: Diagnosis not present

## 2021-07-02 DIAGNOSIS — H838X3 Other specified diseases of inner ear, bilateral: Secondary | ICD-10-CM | POA: Diagnosis not present

## 2021-08-08 ENCOUNTER — Other Ambulatory Visit: Payer: Self-pay

## 2021-08-08 ENCOUNTER — Encounter (HOSPITAL_BASED_OUTPATIENT_CLINIC_OR_DEPARTMENT_OTHER): Payer: Self-pay | Admitting: Emergency Medicine

## 2021-08-08 ENCOUNTER — Emergency Department (HOSPITAL_BASED_OUTPATIENT_CLINIC_OR_DEPARTMENT_OTHER): Payer: Medicare Other

## 2021-08-08 ENCOUNTER — Emergency Department (HOSPITAL_BASED_OUTPATIENT_CLINIC_OR_DEPARTMENT_OTHER)
Admission: EM | Admit: 2021-08-08 | Discharge: 2021-08-08 | Disposition: A | Discharge status: Home / Self Care | Payer: Medicare Other | Attending: Emergency Medicine | Admitting: Emergency Medicine

## 2021-08-08 ENCOUNTER — Emergency Department (HOSPITAL_COMMUNITY): Payer: Medicare Other

## 2021-08-08 DIAGNOSIS — R42 Dizziness and giddiness: Secondary | ICD-10-CM

## 2021-08-08 DIAGNOSIS — I129 Hypertensive chronic kidney disease with stage 1 through stage 4 chronic kidney disease, or unspecified chronic kidney disease: Secondary | ICD-10-CM | POA: Diagnosis not present

## 2021-08-08 DIAGNOSIS — N183 Chronic kidney disease, stage 3 unspecified: Secondary | ICD-10-CM | POA: Diagnosis not present

## 2021-08-08 DIAGNOSIS — J3489 Other specified disorders of nose and nasal sinuses: Secondary | ICD-10-CM | POA: Diagnosis not present

## 2021-08-08 DIAGNOSIS — G319 Degenerative disease of nervous system, unspecified: Secondary | ICD-10-CM | POA: Diagnosis not present

## 2021-08-08 LAB — BASIC METABOLIC PANEL
Anion gap: 8 (ref 5–15)
BUN: 20 mg/dL (ref 8–23)
CO2: 29 mmol/L (ref 22–32)
Calcium: 9.3 mg/dL (ref 8.9–10.3)
Chloride: 102 mmol/L (ref 98–111)
Creatinine, Ser: 1.49 mg/dL — ABNORMAL HIGH (ref 0.61–1.24)
GFR, Estimated: 49 mL/min — ABNORMAL LOW (ref >60)
Glucose, Bld: 90 mg/dL (ref 70–99)
Potassium: 4.1 mmol/L (ref 3.5–5.1)
Sodium: 139 mmol/L (ref 135–145)

## 2021-08-08 LAB — CBC WITH DIFFERENTIAL/PLATELET
Abs Immature Granulocytes: 0.03 10*3/uL (ref 0.00–0.07)
Basophils Absolute: 0 10*3/uL (ref 0.0–0.1)
Basophils Relative: 1 %
Eosinophils Absolute: 0.2 10*3/uL (ref 0.0–0.5)
Eosinophils Relative: 3 %
HCT: 45.4 % (ref 39.0–52.0)
Hemoglobin: 15.3 g/dL (ref 13.0–17.0)
Immature Granulocytes: 1 %
Lymphocytes Relative: 24 %
Lymphs Abs: 1.5 10*3/uL (ref 0.7–4.0)
MCH: 30.1 pg (ref 26.0–34.0)
MCHC: 33.7 g/dL (ref 30.0–36.0)
MCV: 89.2 fL (ref 80.0–100.0)
Monocytes Absolute: 0.5 10*3/uL (ref 0.1–1.0)
Monocytes Relative: 8 %
Neutro Abs: 4.1 10*3/uL (ref 1.7–7.7)
Neutrophils Relative %: 63 %
Platelets: 144 10*3/uL — ABNORMAL LOW (ref 150–400)
RBC: 5.09 MIL/uL (ref 4.22–5.81)
RDW: 12.9 % (ref 11.5–15.5)
WBC: 6.4 10*3/uL (ref 4.0–10.5)
nRBC: 0 % (ref 0.0–0.2)

## 2021-08-08 LAB — URINALYSIS, ROUTINE W REFLEX MICROSCOPIC
Bilirubin Urine: NEGATIVE
Glucose, UA: NEGATIVE mg/dL
Hgb urine dipstick: NEGATIVE
Ketones, ur: NEGATIVE mg/dL
Leukocytes,Ua: NEGATIVE
Nitrite: NEGATIVE
Protein, ur: NEGATIVE mg/dL
Specific Gravity, Urine: 1.012 (ref 1.005–1.030)
pH: 7 (ref 5.0–8.0)

## 2021-08-08 MED ORDER — MECLIZINE HCL 25 MG PO TABS: 25.0000 mg | ORAL_TABLET | Freq: Three times a day (TID) | ORAL | 0 refills | Status: DC | PRN

## 2021-08-08 MED ORDER — SODIUM CHLORIDE 0.9 % IV BOLUS
500.0000 mL | Freq: Once | INTRAVENOUS | Status: AC
  Administered 2021-08-08: 500 mL via INTRAVENOUS

## 2021-08-08 NOTE — ED Provider Notes (Signed)
[facility] EMERGENCY DEPT Provider Note   CSN: [id number] Arrival date & time: 08/08/21  1019     History Chief Complaint  Patient presents with   Dizziness    Andre [name] is a 75 y.o. male.  Patient is a 75 year old male with a history of hypertension and chronic kidney disease.  He presents with feeling off balance.  This started 2 days ago.  He says when he gets up he feels a little lightheaded and then he feels off balance when he is walking.  He feels like he is going to fall but has not actually fallen.  He denies any vision changes.  No speech deficits.  No numbness or weakness to his extremities.  No chest pain or shortness of breath.  No vomiting or diarrhea.  No other recent illnesses.  No cough or cold symptoms.      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 No pertinent surgical history.     Family History  Problem Relation Age of Onset   Stroke Mother 55   Bronchiolitis Father 78    Social History   Tobacco Use   Smoking status: Never   Smokeless tobacco: Never  Substance Use Topics   Alcohol use: Yes    Comment: Rarely    Drug use: Never    Home Medications Prior to Admission medications   Medication Sig Start Date End Date Taking? Authorizing Provider  allopurinol (ZYLOPRIM) 100 MG tablet Take 1 tablet (100 mg total) by mouth daily. 04/23/21  Yes [name], MD  chlorthalidone (HYGROTON) 25 MG tablet Take 12.5 mg by mouth daily.   Yes [provider]  irbesartan (AVAPRO) 75 MG tablet Take 75 mg by mouth daily.   Yes  [provider]  pravastatin (PRAVACHOL) 20 MG tablet Take 20 mg by mouth daily.   Yes [provider]  tamsulosin (FLOMAX) 0.4 MG CAPS capsule Take 0.4 mg by mouth at bedtime.   Yes [provider]  Vitamin D, Ergocalciferol, 50 MCG (2000 UT) CAPS Take by mouth daily.   Yes [provider]  FLUTICASONE PROPIONATE NA Place into the nose daily.    [provider]  indomethacin (INDOCIN) 50 MG capsule Take 1 capsule (50 mg total) by mouth once as needed for up to 1 dose (gout flare). 10/26/20   Reed, Tiffany L, DO    Allergies    Colchicine  Review of Systems   Review of Systems  Constitutional:  Negative for chills, diaphoresis, fatigue and fever.  HENT:  Negative for congestion, rhinorrhea and sneezing.   Eyes: Negative.   Respiratory:  Negative for cough, chest tightness and shortness of breath.   Cardiovascular:  Negative for chest pain and leg swelling.  Gastrointestinal:  Negative for abdominal pain, blood in stool, diarrhea, nausea and vomiting.  Genitourinary:  Negative for difficulty urinating, flank pain, frequency and hematuria.  Musculoskeletal:  Negative for arthralgias and back pain.  Skin:  Negative for rash.  Neurological:  Positive for dizziness and light-headedness. Negative for speech difficulty, weakness, numbness and headaches.  Physical Exam Updated Vital Signs BP 110/61   Pulse (!) 58   Temp 98.4 F (36.9 C) (Oral)   Resp 10   Ht 5' 8.5" (1.74 m)   Wt 94.8 kg   SpO2 98%   BMI 31.32 kg/m   Physical Exam Constitutional:      Appearance: He is well-developed.  HENT:     Head: Normocephalic and atraumatic.  Eyes:     Pupils: Pupils are equal, round, and reactive to light.  Cardiovascular:     Rate and Rhythm: Normal rate and regular rhythm.     Heart sounds: Normal heart sounds.  Pulmonary:     Effort: Pulmonary effort is normal. No respiratory distress.     Breath sounds: Normal breath sounds. No wheezing  or rales.  Chest:     Chest wall: No tenderness.  Abdominal:     General: Bowel sounds are normal.     Palpations: Abdomen is soft.     Tenderness: There is no abdominal tenderness. There is no guarding or rebound.  Musculoskeletal:        General: Normal range of motion.     Cervical back: Normal range of motion and neck supple.  Lymphadenopathy:     Cervical: No cervical adenopathy.  Skin:    General: Skin is warm and dry.     Findings: No rash.  Neurological:     Mental Status: He is alert and oriented to person, place, and time.     Comments: Motor 5/5 all extremities Sensation grossly intact to LT all extremities Finger to Nose intact, no pronator drift CN II-XII grossly intact Gait normal     ED Results / Procedures / Treatments   Labs (all labs ordered are listed, but only abnormal results are displayed) Labs Reviewed  BASIC METABOLIC PANEL - Abnormal; Notable for the following components:      Result Value   Creatinine, Ser 1.49 (*)    GFR, Estimated 49 (*)    All other components within normal limits  CBC WITH DIFFERENTIAL/PLATELET - Abnormal; Notable for the following components:   Platelets 144 (*)    All other components within normal limits  URINALYSIS, ROUTINE W REFLEX MICROSCOPIC    EKG EKG Interpretation  Date/Time:  Sunday August 08 2021 10:32:10 EDT Ventricular Rate:  64 PR Interval:  147 QRS Duration: 85 QT Interval:  406 QTC Calculation: 419 R Axis:   -9 Text Interpretation: Sinus rhythm Atrial premature complex Low voltage, precordial leads No old tracing to compare Confirmed by Malvin Johns [phone] on 08/08/2021 10:47:49 AM  Radiology CT HEAD WO CONTRAST (5MM)  Result Date: 08/08/2021 CLINICAL DATA:  Patient with dizziness. EXAM: CT HEAD WITHOUT CONTRAST TECHNIQUE: Contiguous axial images were obtained from the base of the skull through the vertex without intravenous contrast. COMPARISON:  None. FINDINGS: Brain: Ventricles and sulci are  appropriate for patient's age. No evidence for acute cortically based infarct, intracranial hemorrhage, mass lesion or mass effect. Vascular: Unremarkable Skull: Intact. Sinuses/Orbits: Mastoid air cells are unremarkable. Paranasal sinuses are well aerated. Other: None IMPRESSION: No acute intracranial process. Electronically Signed   By: Lovey Newcomer M.D.   On: 08/08/2021 11:48    Procedures Procedures   Medications Ordered in ED Medications  sodium chloride 0.9 % bolus 500 mL (500 mLs Intravenous New Bag/Given 08/08/21 1145)    ED Course  I have reviewed the triage vital signs and the nursing notes.  Pertinent labs & imaging results that were available during  my care of the patient were reviewed by me and considered in my medical decision making (see chart for details).    MDM Rules/Calculators/A&P                           Patient is a 75 year old male who presents with dizziness/feeling off balance.  He does not have any other neurologic deficits.  No significant headache.  He had a head CT which shows no acute abnormality.  His labs are nonconcerning.  His creatinine is at baseline.  He was given IV fluids.  He ambulated and says maybe he feels a little bit better but still feels a little off balance.  He describes somewhat of a vertiginous feeling but more that he just feels off balance.  I discussed with him and his daughter getting an MRI.  Given that he is not back to baseline, it has been decided that he will go to [facility] emergency department to get a brain MRI to rule out a stroke.  If negative, he can be likely discharged with follow-up with his PCP.  I spoke with Dr. Almyra Free who has accepted the patient for transfer. Final Clinical Impression(s) / ED Diagnoses Final diagnoses:  Dizziness    Rx / DC Orders ED Discharge Orders     None        Malvin Johns, MD 08/08/21 1430

## 2021-08-08 NOTE — Discharge Instructions (Addendum)
We saw you in the ER for dizziness.  MRI of your brain did not reveal any acute stroke. We have reviewed your case with a neurologist, who also clears you from stroke perspective.  Recommend he start taking the medication prescribed and try to perform Epley maneuver's 2 or 3 times a day.  Please see your primary care doctor in 1 week and discuss if you need to see an ENT doctor.  Be careful with ambulation to prevent any falls.

## 2021-08-08 NOTE — ED Notes (Signed)
Pt ambulated whole unit without difficulty.

## 2021-08-08 NOTE — ED Provider Notes (Signed)
Discussed with Dr. Livia Snellen. She reviewed MRI w/ me and we discussed the patient's symptoms.  Dr. Livia Snellen recommends outpatient f/u with his PCP at this time.  The microhemorrhages are inconsequential in the setting of patient not being on any anticoagulation.  His symptoms are not necessarily consistent with the supra and infratentorial microhemorrhages seen.  No acute findings on MRI that can explain his symptoms.  On my exam, patient did not have any meningismus, nystagmus and upon ambulation he did not have profoundly ataxic gait, just an incidence where he had to rebalance when he was turning.  At this time, patient is stable for discharge.  We will advised him to follow-up with PCP and at that time they can discuss further follow-up with ENT if needed.  Strict ER return precautions have been discussed, and patient is agreeing with the plan and is comfortable with the workup done and the recommendations from the ER.    Varney Biles, MD 08/08/21 1850

## 2021-08-08 NOTE — ED Provider Notes (Signed)
Andre [name] is a 75 year old male who presents as a transfer from [place] for MRI to rule out stroke. He was seen by Dr. Tamera Punt at [facility] ED for vertigo.  CT head non-con negative. On arrival to [facility] emergency department he is hemodynamically stable.  No vertigo sitting in chair.  He is aware of the plan of care.    Mickie Hillier, PA-C 08/08/21 Graham, Ankit, MD 08/12/21 [phone]

## 2021-08-08 NOTE — ED Notes (Signed)
Pt 's daughter to transport POV to [place] ed for MRI. Report called to Roselyn Reef, charge nurse ED [place].

## 2021-08-08 NOTE — ED Triage Notes (Signed)
Pt has noticed since Friday his balance has been "off". Pt reports when he is walking he feels like he is going to fall. Pt also reports decrease in peripheral vision.

## 2021-08-10 ENCOUNTER — Encounter: Payer: Self-pay | Admitting: Family Medicine

## 2021-08-10 ENCOUNTER — Other Ambulatory Visit: Payer: Self-pay

## 2021-08-10 ENCOUNTER — Ambulatory Visit (INDEPENDENT_AMBULATORY_CARE_PROVIDER_SITE_OTHER): Payer: Medicare Other | Admitting: Family Medicine

## 2021-08-10 DIAGNOSIS — R42 Dizziness and giddiness: Secondary | ICD-10-CM | POA: Insufficient documentation

## 2021-08-10 NOTE — Progress Notes (Signed)
Provider:  Alain Honey, MD  Careteam: Patient Care Team: [name], MD as PCP - General (Family Medicine)  PLACE OF SERVICE:  [place]  Advanced Directive information    Allergies  Allergen Reactions   Colchicine     Upset stomach     No chief complaint on file.    HPI: Patient is a 75 y.o. male patient is seen in follow-up.  He went to the emergency room on 9 4 with dizziness.  There he was examined and underwent MRI which failed to reveal definite etiology.  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 He describes the dizziness is more of loss of balance rather than real vertigo or lightheadedness.  He was given prescription for meclizine as well as patient instructions on performing the Epley maneuver.  Review of Systems:  Review of Systems  Neurological:  Positive for dizziness.  All other systems reviewed and are negative.  Past Medical History:  Diagnosis Date   CKD (chronic kidney disease), stage III ([facility])    HTN (hypertension)    History reviewed. No pertinent surgical history. Social History:   reports that he has never smoked. He has never used smokeless tobacco. He reports current alcohol use. He reports that he does not use drugs.  Family History  Problem Relation Age of Onset   Stroke Mother 45   Bronchiolitis Father 65    Medications: Patient's Medications  New Prescriptions   No medications on file  Previous Medications   ALLOPURINOL (ZYLOPRIM) 100 MG TABLET    Take 1 tablet (100 mg total) by mouth daily.   CHLORTHALIDONE (HYGROTON) 25 MG TABLET    Take 12.5 mg by mouth daily.   FLUTICASONE PROPIONATE NA    Place into the nose daily.   INDOMETHACIN (INDOCIN) 50 MG CAPSULE    Take 1 capsule (50 mg total) by mouth once as needed for up to 1 dose (gout flare).   IRBESARTAN (AVAPRO) 75 MG TABLET    Take 75 mg by mouth daily.   MECLIZINE (ANTIVERT) 25 MG TABLET    Take 1 tablet (25 mg total) by mouth 3 (three) times daily as needed  for dizziness.   PRAVASTATIN (PRAVACHOL) 20 MG TABLET    Take 20 mg by mouth daily.   TAMSULOSIN (FLOMAX) 0.4 MG CAPS CAPSULE    Take 0.4 mg by mouth at bedtime.   VITAMIN D, ERGOCALCIFEROL, 50 MCG (2000 UT) CAPS    Take by mouth daily.  Modified Medications   No medications on file  Discontinued Medications   No medications on file    Physical Exam:  There were no vitals filed for this visit. There is no height or weight on file to calculate BMI. Wt Readings from Last 3 Encounters:  08/08/21 209 lb (94.8 kg)  04/28/21 200 lb 12.8 oz (91.1 kg)  12/30/20 207 lb 6.4 oz (94.1 kg)    Physical Exam Vitals and nursing note reviewed.  Constitutional:      Appearance: Normal appearance.  HENT:     Right Ear: Tympanic membrane, ear canal and external ear normal.     Left Ear: Tympanic membrane, ear canal and external ear normal.  Cardiovascular:     Rate and Rhythm: Normal rate and regular rhythm.  Pulmonary:     Effort: Pulmonary effort is normal.     Breath sounds: Normal breath sounds.  Neurological:     Mental Status: He is alert.     Comments:  Rhomberg and finger-to-nose are normal but patient has problem performing tandem gait    Labs reviewed: Basic Metabolic Panel: Recent Labs    10/27/20 0825 08/08/21 1114  NA 141 139  K 4.6 4.1  CL 103 102  CO2 30 29  GLUCOSE 102* 90  BUN 20 20  CREATININE 1.44* 1.49*  CALCIUM 9.5 9.3   Liver Function Tests: Recent Labs    10/27/20 0825  AST 21  ALT 35  BILITOT 0.8  PROT 6.7   No results for input(s): LIPASE, AMYLASE in the last 8760 hours. No results for input(s): AMMONIA in the last 8760 hours. CBC: Recent Labs    10/27/20 0825 08/08/21 1114  WBC 6.0 6.4  NEUTROABS 3,618 4.1  HGB 16.3 15.3  HCT 49.3 45.4  MCV 88.7 89.2  PLT 162 144*   Lipid Panel: Recent Labs    10/27/20 0825  CHOL 162  HDL 53  LDLCALC 86  TRIG 129  CHOLHDL 3.1   TSH: No results for input(s): TSH in the last 8760  hours. A1C: No results found for: HGBA1C   Assessment/Plan  1. Dizziness  - Ambulatory referral to ENT I have encouraged Epley maneuver.  He has not refilled Rx for meclizine.  Patient requesting ENT referral due to his history of Mnire's disease  Alain Honey, MD [place] [phone]

## 2021-08-17 ENCOUNTER — Ambulatory Visit: Payer: Medicare Other | Admitting: Family Medicine

## 2021-08-23 DIAGNOSIS — Z23 Encounter for immunization: Secondary | ICD-10-CM | POA: Diagnosis not present

## 2021-09-01 ENCOUNTER — Other Ambulatory Visit: Payer: Self-pay | Admitting: Family Medicine

## 2021-09-15 DIAGNOSIS — Z23 Encounter for immunization: Secondary | ICD-10-CM | POA: Diagnosis not present

## 2021-09-16 DIAGNOSIS — H9113 Presbycusis, bilateral: Secondary | ICD-10-CM | POA: Diagnosis not present

## 2021-09-16 DIAGNOSIS — R2689 Other abnormalities of gait and mobility: Secondary | ICD-10-CM | POA: Diagnosis not present

## 2021-10-15 ENCOUNTER — Other Ambulatory Visit: Payer: Self-pay | Admitting: *Deleted

## 2021-10-15 MED ORDER — TAMSULOSIN HCL 0.4 MG PO CAPS: 0.4000 mg | ORAL_CAPSULE | Freq: Every evening | ORAL | 1 refills | Status: DC

## 2021-10-15 NOTE — Telephone Encounter (Signed)
Pharmacy requested refill

## 2021-10-26 ENCOUNTER — Other Ambulatory Visit: Payer: Self-pay

## 2021-10-26 DIAGNOSIS — E785 Hyperlipidemia, unspecified: Secondary | ICD-10-CM

## 2021-10-26 DIAGNOSIS — I10 Essential (primary) hypertension: Secondary | ICD-10-CM

## 2021-10-26 DIAGNOSIS — M109 Gout, unspecified: Secondary | ICD-10-CM

## 2021-10-26 DIAGNOSIS — N183 Chronic kidney disease, stage 3 unspecified: Secondary | ICD-10-CM

## 2021-10-27 ENCOUNTER — Other Ambulatory Visit: Payer: Self-pay

## 2021-10-27 ENCOUNTER — Other Ambulatory Visit: Payer: Medicare Other

## 2021-10-27 DIAGNOSIS — E785 Hyperlipidemia, unspecified: Secondary | ICD-10-CM

## 2021-10-27 DIAGNOSIS — I10 Essential (primary) hypertension: Secondary | ICD-10-CM

## 2021-10-27 DIAGNOSIS — N183 Chronic kidney disease, stage 3 unspecified: Secondary | ICD-10-CM

## 2021-10-27 DIAGNOSIS — M109 Gout, unspecified: Secondary | ICD-10-CM | POA: Diagnosis not present

## 2021-10-28 LAB — CBC WITH DIFFERENTIAL/PLATELET
Absolute Monocytes: 464 cells/uL (ref 200–950)
Basophils Absolute: 41 cells/uL (ref 0–200)
Basophils Relative: 0.7 %
Eosinophils Absolute: 371 cells/uL (ref 15–500)
Eosinophils Relative: 6.4 %
HCT: 51.3 % — ABNORMAL HIGH (ref 38.5–50.0)
Hemoglobin: 16.9 g/dL (ref 13.2–17.1)
Lymphs Abs: 1984 cells/uL (ref 850–3900)
MCH: 29.7 pg (ref 27.0–33.0)
MCHC: 32.9 g/dL (ref 32.0–36.0)
MCV: 90.2 fL (ref 80.0–100.0)
MPV: 11.7 fL (ref 7.5–12.5)
Monocytes Relative: 8 %
Neutro Abs: 2941 cells/uL (ref 1500–7800)
Neutrophils Relative %: 50.7 %
Platelets: 145 10*3/uL (ref 140–400)
RBC: 5.69 10*6/uL (ref 4.20–5.80)
RDW: 12.5 % (ref 11.0–15.0)
Total Lymphocyte: 34.2 %
WBC: 5.8 10*3/uL (ref 3.8–10.8)

## 2021-10-28 LAB — LIPID PANEL
Cholesterol: 161 mg/dL (ref <200)
HDL: 52 mg/dL (ref >=40)
LDL Cholesterol (Calc): 88 mg/dL (calc)
Non-HDL Cholesterol (Calc): 109 mg/dL (calc) (ref <130)
Total CHOL/HDL Ratio: 3.1 (calc) (ref <5.0)
Triglycerides: 116 mg/dL (ref <150)

## 2021-10-28 LAB — COMPLETE METABOLIC PANEL WITH GFR
AG Ratio: 1.8 (calc) (ref 1.0–2.5)
ALT: 45 U/L (ref 9–46)
AST: 32 U/L (ref 10–35)
Albumin: 4.4 g/dL (ref 3.6–5.1)
Alkaline phosphatase (APISO): 68 U/L (ref 35–144)
BUN/Creatinine Ratio: 18 (calc) (ref 6–22)
BUN: 24 mg/dL (ref 7–25)
CO2: 29 mmol/L (ref 20–32)
Calcium: 9.5 mg/dL (ref 8.6–10.3)
Chloride: 102 mmol/L (ref 98–110)
Creat: 1.37 mg/dL — ABNORMAL HIGH (ref 0.70–1.28)
Globulin: 2.5 g/dL (calc) (ref 1.9–3.7)
Glucose, Bld: 98 mg/dL (ref 65–99)
Potassium: 4.3 mmol/L (ref 3.5–5.3)
Sodium: 140 mmol/L (ref 135–146)
Total Bilirubin: 0.8 mg/dL (ref 0.2–1.2)
Total Protein: 6.9 g/dL (ref 6.1–8.1)
eGFR: 54 mL/min/{1.73_m2} — ABNORMAL LOW (ref >=60)

## 2021-10-28 LAB — URIC ACID: Uric Acid, Serum: 6.8 mg/dL (ref 4.0–8.0)

## 2021-11-03 ENCOUNTER — Ambulatory Visit: Payer: Medicare Other | Admitting: Family Medicine

## 2021-11-04 ENCOUNTER — Ambulatory Visit (INDEPENDENT_AMBULATORY_CARE_PROVIDER_SITE_OTHER): Payer: Medicare Other | Admitting: Family Medicine

## 2021-11-04 ENCOUNTER — Encounter: Payer: Self-pay | Admitting: Family Medicine

## 2021-11-04 ENCOUNTER — Other Ambulatory Visit: Payer: Self-pay

## 2021-11-04 VITALS — BP 120/70 | HR 79 | Temp 96.6°F | Ht 68.5 in | Wt 208.6 lb

## 2021-11-04 DIAGNOSIS — I10 Essential (primary) hypertension: Secondary | ICD-10-CM

## 2021-11-04 DIAGNOSIS — N183 Chronic kidney disease, stage 3 unspecified: Secondary | ICD-10-CM | POA: Diagnosis not present

## 2021-11-04 DIAGNOSIS — E785 Hyperlipidemia, unspecified: Secondary | ICD-10-CM | POA: Diagnosis not present

## 2021-11-04 NOTE — Progress Notes (Signed)
Provider:  Alain Honey, MD  Careteam: Patient Care Team: [name], MD as PCP - General (Family Medicine)  PLACE OF SERVICE:  [place] Directive information    Allergies  Allergen Reactions   Colchicine     Upset stomach     Chief Complaint  Patient presents with   Medical Management of Chronic Issues    Patient presents today for a 6 month follow-up.   Quality Metric Gaps    TDAP, zoster,pneumonia, flu, COVID booster     HPI: Patient is a 75 y.o. Andre [name].  This is a routine 32-month follow-up for this resident at [facility].  He has no specific complaints today.  He was last seen here for dizziness but that was only a transient complaint and has not had any further episodes. There have been no flares and gout.  He does take allopurinol to maintain uric acid levels. He had lab work done last week.  We discussed and went over results.  Lipids are in good shape.  Metabolic function normal uric acid level of 6.8. He continues to swim 2 to 3 days a week in the pool at friend's home [place].  Review of Systems:  Review of Systems  Constitutional: Negative.   Respiratory: Negative.    Cardiovascular: Negative.   Genitourinary: Negative.   Musculoskeletal: Negative.   Neurological: Negative.   Psychiatric/Behavioral: Negative.    All other systems reviewed and are negative.  Past Medical History:  Diagnosis Date   CKD (chronic kidney disease), stage III ([facility])    HTN (hypertension)    No past surgical history on file. Social History:   reports that he has never smoked. He has never used smokeless tobacco. He reports current alcohol use. He reports that he does not use drugs.  Family History  Problem Relation Age of Onset   Stroke Mother 71   Bronchiolitis Father 41    Medications: Patient's Medications  New Prescriptions   No medications on file  Previous Medications   ALLOPURINOL (ZYLOPRIM) 100 MG TABLET    TAKE 1 TABLET BY MOUTH  DAILY    CHLORTHALIDONE (HYGROTON) 25 MG TABLET    Take 12.5 mg by mouth daily.   FLUTICASONE PROPIONATE NA    Place into the nose daily.   INDOMETHACIN (INDOCIN) 50 MG CAPSULE    Take 1 capsule (50 mg total) by mouth once as needed for up to 1 dose (gout flare).   IRBESARTAN (AVAPRO) 75 MG TABLET    Take 75 mg by mouth daily.   PRAVASTATIN (PRAVACHOL) 20 MG TABLET    Take 20 mg by mouth daily.   TAMSULOSIN (FLOMAX) 0.4 MG CAPS CAPSULE    Take 1 capsule (0.4 mg total) by mouth at bedtime.   VITAMIN D, ERGOCALCIFEROL, 50 MCG (2000 UT) CAPS    Take by mouth daily.  Modified Medications   No medications on file  Discontinued Medications   MECLIZINE (ANTIVERT) 25 MG TABLET    Take 1 tablet (25 mg total) by mouth 3 (three) times daily as needed for dizziness.    Physical Exam:  Vitals:   11/04/21 0856  BP: 120/70  Pulse: 79  Temp: (!) 96.6 F (35.9 C)  SpO2: 98%  Weight: 208 lb 9.6 oz (94.6 kg)  Height: 5' 8.5" (1.74 m)   Body mass index is 31.26 kg/m. Wt Readings from Last 3 Encounters:  11/04/21 208 lb 9.6 oz (94.6 kg)  08/10/21 205 lb 3.2 oz (  93.1 kg)  08/08/21 209 lb (94.8 kg)    Physical Exam Vitals and nursing note reviewed.  Constitutional:      Appearance: Normal appearance.  Cardiovascular:     Rate and Rhythm: Normal rate and regular rhythm.  Pulmonary:     Effort: Pulmonary effort is normal.     Breath sounds: Normal breath sounds.  Musculoskeletal:        General: Normal range of motion.  Neurological:     General: No focal deficit present.     Mental Status: He is alert and oriented to person, place, and time.    Labs reviewed: Basic Metabolic Panel: Recent Labs    08/08/21 1114 10/27/21 0828  NA 139 140  K 4.1 4.3  CL 102 102  CO2 29 29  GLUCOSE 90 98  BUN 20 24  CREATININE 1.49* 1.37*  CALCIUM 9.3 9.5   Liver Function Tests: Recent Labs    10/27/21 0828  AST 32  ALT 45  BILITOT 0.8  PROT 6.9   No results for input(s): LIPASE, AMYLASE in the  last 8760 hours. No results for input(s): AMMONIA in the last 8760 hours. CBC: Recent Labs    08/08/21 1114 10/27/21 0828  WBC 6.4 5.8  NEUTROABS 4.1 2,941  HGB 15.3 16.9  HCT 45.4 51.3*  MCV 89.2 90.2  PLT 144* 145   Lipid Panel: Recent Labs    10/27/21 0828  CHOL 161  HDL 52  LDLCALC 88  TRIG 116  CHOLHDL 3.1   TSH: No results for input(s): TSH in the last 8760 hours. A1C: No results found for: HGBA1C   Assessment/Plan  1. Benign essential HTN Blood pressure well controlled at 120/70 on irbesartan and chlorthalidone  2. Stage 3 chronic kidney disease, unspecified whether stage 3a or 3b CKD ([facility]) BUN and creatinine are 24 and 1.37 respectively.  He sees nephrologist once a year  3. Hyperlipidemia, unspecified hyperlipidemia type Lipids are at goal with LDL of 88.   Alain Honey, MD [facility] [phone]

## 2021-12-28 DIAGNOSIS — N1831 Chronic kidney disease, stage 3a: Secondary | ICD-10-CM | POA: Diagnosis not present

## 2021-12-28 LAB — BASIC METABOLIC PANEL
Creatinine: 1.5 — AB (ref 0.6–1.3)
Potassium: 4.4 mEq/L (ref 3.5–5.1)

## 2021-12-28 LAB — CBC AND DIFFERENTIAL: Hemoglobin: 17.4 (ref 13.5–17.5)

## 2021-12-28 LAB — VITAMIN D 25 HYDROXY (VIT D DEFICIENCY, FRACTURES): Vit D, 25-Hydroxy: 52

## 2021-12-28 LAB — COMPREHENSIVE METABOLIC PANEL: eGFR: 50

## 2022-01-07 DIAGNOSIS — M109 Gout, unspecified: Secondary | ICD-10-CM | POA: Diagnosis not present

## 2022-01-07 DIAGNOSIS — N400 Enlarged prostate without lower urinary tract symptoms: Secondary | ICD-10-CM | POA: Diagnosis not present

## 2022-01-07 DIAGNOSIS — R809 Proteinuria, unspecified: Secondary | ICD-10-CM | POA: Diagnosis not present

## 2022-01-07 DIAGNOSIS — I129 Hypertensive chronic kidney disease with stage 1 through stage 4 chronic kidney disease, or unspecified chronic kidney disease: Secondary | ICD-10-CM | POA: Diagnosis not present

## 2022-01-07 DIAGNOSIS — N1831 Chronic kidney disease, stage 3a: Secondary | ICD-10-CM | POA: Diagnosis not present

## 2022-01-08 ENCOUNTER — Other Ambulatory Visit: Payer: Self-pay | Admitting: Family Medicine

## 2022-02-15 ENCOUNTER — Other Ambulatory Visit: Payer: Self-pay | Admitting: Family Medicine

## 2022-02-16 ENCOUNTER — Encounter: Payer: Self-pay | Admitting: Adult Health

## 2022-02-16 ENCOUNTER — Ambulatory Visit (INDEPENDENT_AMBULATORY_CARE_PROVIDER_SITE_OTHER): Payer: Medicare Other | Admitting: Adult Health

## 2022-02-16 ENCOUNTER — Other Ambulatory Visit: Payer: Self-pay

## 2022-02-16 VITALS — BP 120/83 | HR 77 | Temp 97.1°F | Ht 68.5 in | Wt 212.6 lb

## 2022-02-16 DIAGNOSIS — J019 Acute sinusitis, unspecified: Secondary | ICD-10-CM | POA: Diagnosis not present

## 2022-02-16 MED ORDER — AZITHROMYCIN 250 MG PO TABS: ORAL_TABLET | ORAL | 0 refills | Status: AC

## 2022-02-16 NOTE — Progress Notes (Signed)
? ?Location:  [facility] ?  ?Place of Service:   clinic  ? ? ?CODE STATUS: full  ? ?Allergies  ?Allergen Reactions  ? Colchicine   ?  Upset stomach   ? ? ?Chief Complaint  ?Patient presents with  ? Acute Visit  ?  Patient states he has head congestion and wheezing for about 2 weeks. Patient has been using flonase for nasal congestion. No shortness of breath or chest tightness.Patient has itchy and watery eyes. Itching has been worse last couple of months.  ? ? ?HPI: ? ?For the past 2 weeks he has had head congestion with an occasional cough. He does have itchy eyes. No sore throat; no shortness of breath; no reports of fevers present. He does take flonase long term daily. He is not feeling any better. He has been able to continue his exercising routine.   ? ? ?Past Medical History:  ?Diagnosis Date  ? CKD (chronic kidney disease), stage III ([place])   ? HTN (hypertension)   ? ? ?No past surgical history on file. ? ?Social History  ? ?Socioeconomic History  ? Marital status: Married  ?  Spouse name: Not on file  ? Number of children: Not on file  ? Years of education: Not on file  ? Highest education level: Master's degree (e.g., MA, MS, MEng, MEd, MSW, MBA)  ?Occupational History  ? Not on file  ?Tobacco Use  ? Smoking status: Never  ? Smokeless tobacco: Never  ?Substance and Sexual Activity  ? Alcohol use: Yes  ?  Comment: Rarely   ? Drug use: Never  ? Sexual activity: Yes  ?Other Topics Concern  ? Not on file  ?Social History Narrative  ? Diet: Regular  ?   ? Do you drink/ eat things with caffeine?  Diet Soda / Coffee  ?   ? Marital status:    Married                           What year were you married ? 1970  ?   ? Do you live in a house, apartment,assistred living, condo, trailer, etc.)?  [facility] / Independent  ?   ? Is it one or more stories? 1  ?   ? How many persons live in your home ? 2  ?   ? Do you have any pets in your home ?(please list)  1 Cat  ?   ? Highest Level of education completed:   MBA  ?   ? Current or past profession:  Geneticist, molecular, Contractor / [employer]  ?   ? Do you exercise? Yes                             Type & how often  Walk / Swim  ?   ? ADVANCED DIRECTIVES (Please bring copies)  ?   ? Do you have a living will?  Yes  ?   ? Do you have a DNR form?     Yes                  If not, do you want to discuss one?   ?   ? Do you have signed POA?HPOA forms?    Yes             If so, please bring to your appointment  ?   ?  FUNCTIONAL STATUS- To be completed by Spouse / child / Staff   ?   ? Do you have difficulty bathing or dressing yourself ? No  ?   ? Do you have difficulty preparing food or eating ? No  ?   ? Do you have difficulty managing your mediation ? No  ?   ? Do you have difficulty managing your finances ? No  ?   ? Do you have difficulty affording your medication ? No  ?   ? ?Social Determinants of Health  ? ?Financial Resource Strain: Not on file  ?Food Insecurity: Not on file  ?Transportation Needs: Not on file  ?Physical Activity: Not on file  ?Stress: Not on file  ?Social Connections: Not on file  ?Intimate Partner Violence: Not on file  ? ?Family History  ?Problem Relation Age of Onset  ? Stroke Mother 69  ? Bronchiolitis Father 57  ? ? ? ? ?VITAL SIGNS ?BP 120/83   Pulse 77   Temp (!) 97.1 ?F (36.2 ?C)   Ht 5' 8.5" (1.74 m)   Wt 212 lb 9.6 oz (96.4 kg)   SpO2 98%   BMI 31.86 kg/m?  ? ?Outpatient Encounter Medications as of 02/16/2022  ?Medication Sig  ? allopurinol (ZYLOPRIM) 100 MG tablet TAKE 1 TABLET BY MOUTH  DAILY  ? chlorthalidone (HYGROTON) 25 MG tablet Take 12.5 mg by mouth 3 (three) times a week.  ? FLUTICASONE PROPIONATE NA Place into the nose daily.  ? irbesartan (AVAPRO) 75 MG tablet Take 75 mg by mouth daily.  ? pravastatin (PRAVACHOL) 20 MG tablet Take 20 mg by mouth daily.  ? tamsulosin (FLOMAX) 0.4 MG CAPS capsule Take 1 capsule (0.4 mg total) by mouth at bedtime.  ? Vitamin D, Ergocalciferol, 50 MCG (2000 UT) CAPS Take by mouth daily.  ?  [DISCONTINUED] indomethacin (INDOCIN) 50 MG capsule Take 1 capsule (50 mg total) by mouth once as needed for up to 1 dose (gout flare).  ? ?No facility-administered encounter medications on file as of 02/16/2022.  ? ? ? ?SIGNIFICANT DIAGNOSTIC EXAMS ? ?Review of Systems  ?Constitutional:  Negative for fever and malaise/fatigue.  ?HENT:  Positive for congestion. Negative for sinus pain and sore throat.   ?Eyes:   ?     Itchy  ?Respiratory:  Positive for cough and wheezing. Negative for shortness of breath.   ?Cardiovascular:  Negative for chest pain, palpitations and leg swelling.  ?Gastrointestinal:  Negative for abdominal pain, constipation and heartburn.  ?Musculoskeletal:  Negative for back pain, joint pain and myalgias.  ?Skin: Negative.   ?Neurological:  Negative for dizziness.  ?Psychiatric/Behavioral:  The patient is not nervous/anxious.   ? ? ?Physical Exam ?Constitutional:   ?   General: He is not in acute distress. ?   Appearance: He is well-developed. He is not diaphoretic.  ?HENT:  ?   Nose: Nose normal.  ?   Mouth/Throat:  ?   Mouth: Mucous membranes are moist.  ?   Pharynx: Oropharynx is clear.  ?Eyes:  ?   Conjunctiva/sclera: Conjunctivae normal.  ?Neck:  ?   Thyroid: No thyromegaly.  ?Cardiovascular:  ?   Rate and Rhythm: Normal rate and regular rhythm.  ?   Heart sounds: Normal heart sounds.  ?Pulmonary:  ?   Effort: Pulmonary effort is normal. No respiratory distress.  ?   Breath sounds: Normal breath sounds.  ?Abdominal:  ?   General: Bowel sounds are normal. There is no distension.  ?  Palpations: Abdomen is soft.  ?   Tenderness: There is no abdominal tenderness.  ?Musculoskeletal:     ?   General: Normal range of motion.  ?   Cervical back: Neck supple.  ?Lymphadenopathy:  ?   Cervical: Cervical adenopathy present.  ?Skin: ?   General: Skin is warm and dry.  ?Neurological:  ?   Mental Status: He is alert and oriented to person, place, and time.  ?Psychiatric:     ?   Mood and Affect: Mood  normal.  ? ? ? ? ?ASSESSMENT/ PLAN: ? ?TODAY ? ?Acute sinusitis: will begin a zpack today; he has been instructed that if he is not feeling better one weekly after completing abt to call office. Has verbalized understanding.  ? ? ?Ok Edwards NP ?[facility]  ?call [phone]  ? ?

## 2022-02-16 NOTE — Patient Instructions (Signed)
You have acute sinusitis you have been given a prescription for a z-pack take 2 pills today then starting tomorrow take 1 tablet daily until finished.   ?

## 2022-02-22 ENCOUNTER — Other Ambulatory Visit: Payer: Self-pay | Admitting: Family Medicine

## 2022-03-02 ENCOUNTER — Telehealth: Payer: Self-pay | Admitting: *Deleted

## 2022-03-02 ENCOUNTER — Other Ambulatory Visit: Payer: Self-pay

## 2022-03-02 ENCOUNTER — Ambulatory Visit (INDEPENDENT_AMBULATORY_CARE_PROVIDER_SITE_OTHER): Payer: Medicare Other | Admitting: Family

## 2022-03-02 VITALS — BP 112/66 | HR 102 | Temp 97.0°F | Resp 16 | Ht 68.5 in | Wt 207.4 lb

## 2022-03-02 DIAGNOSIS — H00014 Hordeolum externum left upper eyelid: Secondary | ICD-10-CM | POA: Diagnosis not present

## 2022-03-02 MED ORDER — AZITHROMYCIN 1 % OP SOLN: OPHTHALMIC | 0 refills | Status: DC

## 2022-03-02 MED ORDER — ERYTHROMYCIN 5 MG/GM OP OINT: 1.0000 "application " | TOPICAL_OINTMENT | Freq: Three times a day (TID) | OPHTHALMIC | 0 refills | Status: AC

## 2022-03-02 NOTE — Telephone Encounter (Signed)
[facility] called and stated that the Azithromycin 1% is no longer made and no longer available. ?  ?Pharmacist is requesting an alternative to be escribed.  ? ?Please Advise.  ? ? ? ? ?

## 2022-03-02 NOTE — Telephone Encounter (Signed)
Erythromycin ointment send to pharmacy. ?

## 2022-03-02 NOTE — Progress Notes (Signed)
? ?Provider: Marlowe Sax FNP-C ? ?Andre Honour, MD ? ?Patient Care Team: ?Andre Honour, MD as PCP - General (Family Medicine) ? ?Extended Emergency Contact Information ?Primary Emergency Contact: Wendie Agreste ?Address: [address] ?         [place], [place] [address] [place] ?Mobile Phone: [phone] ?Relation: Spouse ?Secondary Emergency Contact: Berline Lopes, Amy ? [place] ?Mobile Phone: [phone] ?Relation: Daughter ? ?Code Status:  Full Code  ?Goals of care: Advanced Directive information ? ?  08/10/2021  ?  1:00 PM  ?Advanced Directives  ?Does Patient Have a Medical Advance Directive? No  ?Would patient like information on creating a medical advance directive? No - Patient declined  ? ? ? ?Chief Complaint  ?Patient presents with  ? Acute Visit  ?  Swollen left eye   ? ? ?HPI:  ?Pt is a 76 y.o. male seen today for an acute visit for evaluation of left eye swelling x 3 days.  He describes as being sore and itchy.  Has also had some slight draining.  Swelling on the upper eyelid feels hard.  He denies any fever or chills or changes in vision.  No recent upper respiratory infection.  And no injury to eye. ? ? ? ? ?Past Medical History:  ?Diagnosis Date  ? CKD (chronic kidney disease), stage III ([place])   ? HTN (hypertension)   ? ?No past surgical history on file. ? ?Allergies  ?Allergen Reactions  ? Colchicine   ?  Upset stomach   ? ? ?Outpatient Encounter Medications as of 03/02/2022  ?Medication Sig  ? allopurinol (ZYLOPRIM) 100 MG tablet TAKE 1 TABLET BY MOUTH  DAILY  ? chlorthalidone (HYGROTON) 25 MG tablet Take 12.5 mg by mouth 3 (three) times a week.  ? FLUTICASONE PROPIONATE NA Place into the nose daily.  ? irbesartan (AVAPRO) 75 MG tablet Take 75 mg by mouth daily.  ? pravastatin (PRAVACHOL) 20 MG tablet Take 20 mg by mouth daily.  ? tamsulosin (FLOMAX) 0.4 MG CAPS capsule TAKE 1 CAPSULE BY MOUTH AT  BEDTIME  ? Vitamin D, Ergocalciferol, 50 MCG (2000 UT) CAPS  Take by mouth daily.  ? [DISCONTINUED] azithromycin (AZASITE) 1 % ophthalmic solution Instil 2 drops to right eye twice daily x 1 day then one drop daily x 5 days.  ? azithromycin (AZASITE) 1 % ophthalmic solution Instil 2 drops to right eye left daily x 1 day then one drop daily x 5 days.  ? ?No facility-administered encounter medications on file as of 03/02/2022.  ? ? ?Review of Systems  ?Constitutional:  Negative for appetite change, chills, fatigue, fever and unexpected weight change.  ?HENT:  Negative for congestion, dental problem, ear discharge, ear pain, facial swelling, hearing loss, nosebleeds, postnasal drip, rhinorrhea, sinus pressure, sinus pain, sneezing, sore throat and tinnitus.   ?Eyes:  Positive for pain, discharge, redness and itching. Negative for visual disturbance.  ?Respiratory:  Negative for cough, chest tightness, shortness of breath and wheezing.   ?Cardiovascular:  Negative for chest pain, palpitations and leg swelling.  ?Skin:  Negative for color change, pallor, rash and wound.  ?Neurological:  Negative for dizziness, weakness, light-headedness and headaches.  ? ?Immunization History  ?Administered Date(s) Administered  ? Influenza, High Dose Seasonal PF 10/01/2018, 09/05/2019  ? Influenza-Unspecified 09/05/2019, 09/22/2020  ? PFIZER(Purple Top)SARS-COV-2 Vaccination 12/29/2019, 01/20/2020, 09/03/2020  ? ?Pertinent  Health Maintenance Due  ?Topic Date Due  ? COLONOSCOPY (Pts 45-1yr Insurance coverage will need to be  confirmed)  09/18/2029  ? INFLUENZA VACCINE  Completed  ? ? ?  10/26/2020  ?  1:40 PM 12/30/2020  ?  3:09 PM 08/08/2021  ? 10:46 AM 08/10/2021  ? 12:59 PM 11/04/2021  ?  8:56 AM  ?Fall Risk  ?Falls in the past year? 0 0  0 0  ?Was there an injury with Fall? 0 0   0  ?Fall Risk Category Calculator 0 0   0  ?Fall Risk Category Low Low   Low  ?Patient Fall Risk Level Low fall risk Low fall risk Low fall risk Low fall risk Low fall risk  ?Patient at Risk for Falls Due to     No Fall  Risks  ?Fall risk Follow up    Falls evaluation completed Falls evaluation completed;Education provided;Falls prevention discussed  ? ?Functional Status Survey: ?  ? ?Vitals:  ? 03/02/22 1126  ?BP: 112/66  ?Pulse: (!) 102  ?Resp: 16  ?Temp: (!) 97 ?F (36.1 ?C)  ?Weight: 207 lb 6.4 oz (94.1 kg)  ?Height: 5' 8.5" (1.74 m)  ? ?Body mass index is 31.08 kg/m?Marland Kitchen ?Physical Exam ?Vitals reviewed.  ?Constitutional:   ?   General: He is not in acute distress. ?   Appearance: Normal appearance. He is normal weight. He is not ill-appearing or diaphoretic.  ?HENT:  ?   Head: Normocephalic.  ?   Right Ear: Tympanic membrane, ear canal and external ear normal. There is no impacted cerumen.  ?   Left Ear: Tympanic membrane, ear canal and external ear normal. There is no impacted cerumen.  ?   Nose: Nose normal. No congestion or rhinorrhea.  ?   Mouth/Throat:  ?   Mouth: Mucous membranes are moist.  ?   Pharynx: Oropharynx is clear. No oropharyngeal exudate or posterior oropharyngeal erythema.  ?Eyes:  ?   General: No scleral icterus.    ?   Right eye: No foreign body, discharge or hordeolum.     ?   Left eye: Discharge and hordeolum present. ?   Extraocular Movements: Extraocular movements intact.  ?   Conjunctiva/sclera: Conjunctivae normal.  ?   Pupils: Pupils are equal, round, and reactive to light.  ?Neck:  ?   Vascular: No carotid bruit.  ?Cardiovascular:  ?   Rate and Rhythm: Normal rate and regular rhythm.  ?   Pulses: Normal pulses.  ?   Heart sounds: Normal heart sounds. No murmur heard. ?  No friction rub. No gallop.  ?Pulmonary:  ?   Effort: Pulmonary effort is normal. No respiratory distress.  ?   Breath sounds: Normal breath sounds. No wheezing, rhonchi or rales.  ?Chest:  ?   Chest wall: No tenderness.  ?Musculoskeletal:  ?   Cervical back: Normal range of motion. No rigidity or tenderness.  ?Lymphadenopathy:  ?   Cervical: No cervical adenopathy.  ?Skin: ?   General: Skin is warm and dry.  ?   Coloration: Skin is not  pale.  ?   Findings: No erythema or rash.  ?Neurological:  ?   Mental Status: He is alert and oriented to person, place, and time.  ?   Gait: Gait normal.  ?Psychiatric:     ?   Mood and Affect: Mood normal.     ?   Speech: Speech normal.     ?   Behavior: Behavior normal.  ? ? ?Labs reviewed: ?Recent Labs  ?  08/08/21 ?1114 10/27/21 ?0828  ?NA 139 140  ?K  4.1 4.3  ?CL 102 102  ?CO2 29 29  ?GLUCOSE 90 98  ?BUN 20 24  ?CREATININE 1.49* 1.37*  ?CALCIUM 9.3 9.5  ? ?Recent Labs  ?  10/27/21 ?1751  ?AST 32  ?ALT 45  ?BILITOT 0.8  ?PROT 6.9  ? ?Recent Labs  ?  08/08/21 ?1114 10/27/21 ?0828  ?WBC 6.4 5.8  ?NEUTROABS 4.1 2,941  ?HGB 15.3 16.9  ?HCT 45.4 51.3*  ?MCV 89.2 90.2  ?PLT 144* 145  ? ?No results found for: TSH ?No results found for: HGBA1C ?Lab Results  ?Component Value Date  ? CHOL 161 10/27/2021  ? HDL 52 10/27/2021  ? [place] 88 10/27/2021  ? TRIG 116 10/27/2021  ? CHOLHDL 3.1 10/27/2021  ? ? ?Significant Diagnostic Results in last 30 days:  ?No results found. ? ?Assessment/Plan ? ?Hordeolum externum of left upper eyelid ? Afebrile ?Left upper eyelid swollen, tender to touch and with small head.Yellow crusted drainage noted on the eyelashes.Surrounding skin without any redness. ? ?- wash left eyelid with warm water and baby shampoo and rinse twice daily prior to applying eye drops.Apply warm compressor 10- 15 minutes daily ? ?- Avoid touching eye and wash hands prior to applying medication. ? ?-Apply erythromycin ophthalmic ointment 1 application 3 times daily for 7 days. ? ?Family/ staff Communication: Reviewed plan of care with patient and wife verbalized understanding ? ?Labs/tests ordered: None  ? ?Next Appointment: As needed if symptoms worsen or fail to improve ? ?Sandrea Hughs, NP ?

## 2022-03-02 NOTE — Patient Instructions (Addendum)
-  wash left eyelid with warm water and baby shampoo and rinse twice daily prior to applying eye drops.Apply warm compressor 10- 15 minutes daily ?- Avoid touching eye and wash hands prior to applying medication.  ?

## 2022-03-03 NOTE — Telephone Encounter (Signed)
Patient aware.

## 2022-03-04 ENCOUNTER — Ambulatory Visit (INDEPENDENT_AMBULATORY_CARE_PROVIDER_SITE_OTHER): Payer: Medicare Other | Admitting: Nurse Practitioner

## 2022-03-04 ENCOUNTER — Encounter: Payer: Self-pay | Admitting: Nurse Practitioner

## 2022-03-04 VITALS — BP 110/68 | HR 76 | Temp 97.1°F

## 2022-03-04 DIAGNOSIS — L03213 Periorbital cellulitis: Secondary | ICD-10-CM

## 2022-03-04 DIAGNOSIS — H00014 Hordeolum externum left upper eyelid: Secondary | ICD-10-CM

## 2022-03-04 MED ORDER — DOXYCYCLINE HYCLATE 100 MG PO TABS
100.0000 mg | ORAL_TABLET | Freq: Two times a day (BID) | ORAL | 0 refills | Status: DC
Start: 2022-03-04 — End: 2022-11-15

## 2022-03-04 NOTE — Patient Instructions (Signed)
Warm compresses three to 4 times daily with baby shampoo ?Can continue ointment but add doxycyline 100 mg twice daily with food.  ? ? ?

## 2022-03-04 NOTE — Progress Notes (Signed)
? ? ?Careteam: ?Patient Care Team: ?Andre Honour, MD as PCP - General (Family Medicine) ? ?PLACE OF SERVICE:  ?[facility]  ?Advanced Directive information ?  ? ?Allergies  ?Allergen Reactions  ? Colchicine   ?  Upset stomach   ? ? ?Chief Complaint  ?Patient presents with  ? Acute Visit  ?  Ongoing eye concerns- no better  ? ? ? ?HPI: Patient is a 76 y.o. male for follow up on left eye.  ?He is taking eythromcyin feels like redness and swelling has worsen over the last 24 hours with an area coming to a head.  ?No worsening of pain.  ?No visual changes ? ? ?Review of Systems:  ?Review of Systems  ?Constitutional:  Negative for chills and fever.  ?Eyes:   ?     Redness and swelling to left eye lid.  ? ?Past Medical History:  ?Diagnosis Date  ? CKD (chronic kidney disease), stage III ([place])   ? HTN (hypertension)   ? ?No past surgical history on file. ?Social History: ?  reports that he has never smoked. He has never used smokeless tobacco. He reports current alcohol use. He reports that he does not use drugs. ? ?Family History  ?Problem Relation Age of Onset  ? Stroke Mother 2  ? Bronchiolitis Father 77  ? ? ?Medications: ?Patient's Medications  ?New Prescriptions  ? No medications on file  ?Previous Medications  ? ALLOPURINOL (ZYLOPRIM) 100 MG TABLET    TAKE 1 TABLET BY MOUTH  DAILY  ? CHLORTHALIDONE (HYGROTON) 25 MG TABLET    Take 12.5 mg by mouth 3 (three) times a week.  ? ERYTHROMYCIN OPHTHALMIC OINTMENT    Place 1 application. into the left eye 3 (three) times daily for 5 days.  ? FLUTICASONE PROPIONATE NA    Place into the nose daily.  ? IRBESARTAN (AVAPRO) 75 MG TABLET    Take 75 mg by mouth daily.  ? LORATADINE (CLARITIN) 10 MG TABLET    Take 10 mg by mouth daily.  ? PRAVASTATIN (PRAVACHOL) 20 MG TABLET    Take 20 mg by mouth daily.  ? TAMSULOSIN (FLOMAX) 0.4 MG CAPS CAPSULE    TAKE 1 CAPSULE BY MOUTH AT  BEDTIME  ? VITAMIN D, ERGOCALCIFEROL, 50 MCG (2000 UT) CAPS    Take by mouth daily.  ?Modified  Medications  ? No medications on file  ?Discontinued Medications  ? No medications on file  ? ? ?Physical Exam: ? ?Vitals:  ? 03/04/22 1104  ?BP: 110/68  ?Pulse: 76  ?Temp: (!) 97.1 ?F (36.2 ?C)  ?TempSrc: Temporal  ?SpO2: 99%  ? ?There is no height or weight on file to calculate BMI. ?Wt Readings from Last 3 Encounters:  ?03/02/22 207 lb 6.4 oz (94.1 kg)  ?02/16/22 212 lb 9.6 oz (96.4 kg)  ?11/04/21 208 lb 9.6 oz (94.6 kg)  ? ? ?Physical Exam ?Constitutional:   ?   Appearance: Normal appearance.  ?Eyes:  ?   General:     ?   Left eye: Hordeolum (swelling and redness noted to lid) present. ?   Conjunctiva/sclera: Conjunctivae normal.  ?Neurological:  ?   Mental Status: He is alert.  ? ? ?Labs reviewed: ?Basic Metabolic Panel: ?Recent Labs  ?  08/08/21 ?1114 10/27/21 ?0828  ?NA 139 140  ?K 4.1 4.3  ?CL 102 102  ?CO2 29 29  ?GLUCOSE 90 98  ?BUN 20 24  ?CREATININE 1.49* 1.37*  ?CALCIUM 9.3 9.5  ? ?Liver  Function Tests: ?Recent Labs  ?  10/27/21 ?7425  ?AST 32  ?ALT 45  ?BILITOT 0.8  ?PROT 6.9  ? ?No results for input(s): LIPASE, AMYLASE in the last 8760 hours. ?No results for input(s): AMMONIA in the last 8760 hours. ?CBC: ?Recent Labs  ?  08/08/21 ?1114 10/27/21 ?0828  ?WBC 6.4 5.8  ?NEUTROABS 4.1 2,941  ?HGB 15.3 16.9  ?HCT 45.4 51.3*  ?MCV 89.2 90.2  ?PLT 144* 145  ? ?Lipid Panel: ?Recent Labs  ?  10/27/21 ?9563  ?CHOL 161  ?HDL 52  ?[place] 88  ?TRIG 116  ?CHOLHDL 3.1  ? ?TSH: ?No results for input(s): TSH in the last 8760 hours. ?A1C: ?No results found for: HGBA1C ? ? ?Assessment/Plan ?1. Periorbital cellulitis of left eye ?-increase redness and swelling to eyelid. Will had oral antibiotic for cellulitis ?-to use warm compress TID with babyshampoo ?- doxycycline (VIBRA-TABS) 100 MG tablet; Take 1 tablet (100 mg total) by mouth 2 (two) times daily.  Dispense: 14 tablet; Refill: 0 ?- Ambulatory referral to Ophthalmology ? ?2. Hordeolum externum of left upper eyelid ?- Ambulatory referral to Ophthalmology ?-okay to  continue erythromycin ointment TID ?-warm compresses TID ? ?Andre [name]. Dewaine Oats, AGNP ? ?[facility] ?[phone]  ?

## 2022-04-25 ENCOUNTER — Encounter: Payer: Self-pay | Admitting: Family

## 2022-04-25 ENCOUNTER — Ambulatory Visit (INDEPENDENT_AMBULATORY_CARE_PROVIDER_SITE_OTHER): Payer: Medicare Other | Admitting: Family

## 2022-04-25 VITALS — BP 100/60 | HR 72 | Temp 97.4°F | Resp 16 | Ht 68.5 in | Wt 214.3 lb

## 2022-04-25 DIAGNOSIS — H00012 Hordeolum externum right lower eyelid: Secondary | ICD-10-CM | POA: Diagnosis not present

## 2022-04-25 MED ORDER — ERYTHROMYCIN 5 MG/GM OP OINT: 1.0000 "application " | TOPICAL_OINTMENT | Freq: Three times a day (TID) | OPHTHALMIC | 0 refills | Status: AC

## 2022-04-25 NOTE — Patient Instructions (Signed)
-   continue to cleanse right eye with baby shampoo and warm water prior to applying antibiotic ointment.

## 2022-04-25 NOTE — Progress Notes (Signed)
Provider: Lirio Bach FNP-C  [name], MD  Patient Care Team: [name], MD as PCP - General (Family Medicine)  Extended Emergency Contact Information Primary Emergency Contact: Wendie Agreste Address: [address]          [place], [place] [address] Johnnette Litter of [place] Phone: [phone] Relation: Spouse Secondary Emergency Contact: Reinaldo Raddle [place] of [place] Mobile Phone: [phone] Relation: Daughter  Code Status:  Full Code  Goals of care: Advanced Directive information    04/25/2022    1:06 PM  Advanced Directives  Does Patient Have a Medical Advance Directive? Yes  Type of Paramedic of [place];Living will;Out of facility DNR (pink MOST or yellow form)  Does patient want to make changes to medical advance directive? No - Patient declined  Copy of [place] in Chart? No - copy requested  Would patient like information on creating a medical advance directive? No - Patient declined     Chief Complaint  Patient presents with   Acute Visit    Patient wife complains of stye on both eyes since Thursday 04/21/2022.    HPI:  Pt is a 76 y.o. male seen today for an acute visit for evaluation of stye on right eyes x 5 days Had similar stye in march,2023 ointment was effective along with Doxycycline.  Chart review, doxycycline was prescribed 3/31 2023 due to periorbital cellulitis. No vision changes. Thinks could be due to swimming at the facility.No matting of eye or redness around eye.  He denies any fever, chills or upper respiratory infection.   Past Medical History:  Diagnosis Date   CKD (chronic kidney disease), stage III (HCC)    HTN (hypertension)    History reviewed. No pertinent surgical history.  Allergies  Allergen Reactions   Colchicine     Upset stomach     Outpatient Encounter Medications as of 04/25/2022  Medication Sig   allopurinol (ZYLOPRIM) 100 MG tablet  TAKE 1 TABLET BY MOUTH  DAILY   chlorthalidone (HYGROTON) 25 MG tablet Take 12.5 mg by mouth 3 (three) times a week.   doxycycline (VIBRA-TABS) 100 MG tablet Take 1 tablet (100 mg total) by mouth 2 (two) times daily.   FLUTICASONE PROPIONATE NA Place into the nose daily.   irbesartan (AVAPRO) 75 MG tablet Take 75 mg by mouth daily.   loratadine (CLARITIN) 10 MG tablet Take 10 mg by mouth daily.   pravastatin (PRAVACHOL) 20 MG tablet Take 20 mg by mouth daily.   tamsulosin (FLOMAX) 0.4 MG CAPS capsule TAKE 1 CAPSULE BY MOUTH AT  BEDTIME   Vitamin D, Ergocalciferol, 50 MCG (2000 UT) CAPS Take by mouth daily.   No facility-administered encounter medications on file as of 04/25/2022.    Review of Systems  Constitutional:  Negative for chills and fever.  HENT:  Negative for congestion, ear discharge, ear pain, postnasal drip, rhinorrhea, sinus pressure, sinus pain, sneezing and sore throat.   Eyes:  Positive for itching and visual disturbance. Negative for pain, discharge and redness.  Respiratory:  Negative for cough, chest tightness, shortness of breath and wheezing.   Cardiovascular:  Negative for chest pain, palpitations and leg swelling.  Skin:  Negative for color change, pallor, rash and wound.   Immunization History  Administered Date(s) Administered   Influenza, High Dose Seasonal PF 10/01/2018, 09/05/2019   Influenza-Unspecified 09/05/2019, 09/22/2020   PFIZER(Purple Top)SARS-COV-2 Vaccination 12/29/2019, 01/20/2020, 09/03/2020   Pertinent  Health Maintenance Due  Topic Date Due   INFLUENZA VACCINE  07/05/2022   COLONOSCOPY (Pts 45-41yr Insurance coverage will need to be confirmed)  Discontinued      12/30/2020    3:09 PM 08/08/2021   10:46 AM 08/10/2021   12:59 PM 11/04/2021    8:56 AM 04/25/2022    1:06 PM  [place] Fall[place] the past year? 0  0 0 0  Was there an injury with Fall? 0   0 0  Fall Risk Category Calculator 0   0 0  Fall Risk Category Low   Low Low  Patient  Fall Risk Level Low fall risk Low fall risk Low fall risk Low fall risk Low fall risk  Patient at Risk for Falls Due to    No Fall Risks No Fall Risks  Fall risk Follow up   Falls evaluation completed Falls evaluation completed;Education provided;Falls prevention discussed Falls evaluation completed   Functional Status Survey:    Vitals:   04/25/22 1300  BP: 100/60  Pulse: 72  Resp: 16  Temp: (!) 97.4 F (36.3 C)  SpO2: 96%  Weight: 214 lb 4.8 oz (97.2 kg)  Height: 5' 8.5" (1.74 m)   Body mass index is 32.11 kg/m. Physical Exam Vitals reviewed.  Constitutional:      General: He is not in acute distress.    Appearance: He is obese. He is not ill-appearing.  HENT:     Head: Normocephalic.  Eyes:     General: No scleral icterus.       Right eye: Hordeolum present. No discharge.        Left eye: No discharge.     Conjunctiva/sclera: Conjunctivae normal.     Pupils: Pupils are equal, round, and reactive to light.     Comments: Right lower eyelid erythema and swollen.No discharge noted   Cardiovascular:     Rate and Rhythm: Normal rate and regular rhythm.     Pulses: Normal pulses.     Heart sounds: Normal heart sounds. No murmur heard.   No friction rub. No gallop.  Pulmonary:     Effort: Pulmonary effort is normal. No respiratory distress.     Breath sounds: Normal breath sounds. No wheezing, rhonchi or rales.  Chest:     Chest wall: No tenderness.  Skin:    General: Skin is warm.     Coloration: Skin is not pale.     Findings: No erythema or rash.  Neurological:     Mental Status: He is alert.    Labs reviewed: Recent Labs    08/08/21 1114 10/27/21 0828  NA 139 140  K 4.1 4.3  CL 102 102  CO2 29 29  GLUCOSE 90 98  BUN 20 24  CREATININE 1.49* 1.37*  CALCIUM 9.3 9.5   Recent Labs    10/27/21 0828  AST 32  ALT 45  BILITOT 0.8  PROT 6.9   Recent Labs    08/08/21 1114 10/27/21 0828  WBC 6.4 5.8  NEUTROABS 4.1 2,941  HGB 15.3 16.9  HCT 45.4  51.3*  MCV 89.2 90.2  PLT 144* 145   No results found for: TSH No results found for: HGBA1C Lab Results  Component Value Date   CHOL 161 10/27/2021   HDL 52 10/27/2021   LDLCALC 88 10/27/2021   TRIG 116 10/27/2021   CHOLHDL 3.1 10/27/2021    Significant Diagnostic Results in last 30 days:  No results found.  Assessment/Plan  Hordeolum externum of right lower  eyelid Right lower eyelid redness, swollen with head and tender to touch without any drainage noted. -Continue to cleanse with warm water and baby shampoo and apply warm compresses 5 to 10 minutes daily prior to applying antibiotic ointment no periorbital cellulitis noted we will treat with topical eye ointment no oral antibiotics required. -We will refer to ophthalmology for further evaluation of recurrent stye and vision. - erythromycin ophthalmic ointment; Place 1 application. into both eyes 3 (three) times daily for 7 days.  Dispense: 21 g; Refill: 0 - Ambulatory referral to Ophthalmology  Family/ staff Communication: Reviewed plan of care with patient and wife verbalized understanding  Labs/tests ordered: None   Next Appointment: As needed if symptoms worsen or fail to improve  Sandrea Hughs, NP

## 2022-04-26 DIAGNOSIS — Z23 Encounter for immunization: Secondary | ICD-10-CM | POA: Diagnosis not present

## 2022-04-27 DIAGNOSIS — H00012 Hordeolum externum right lower eyelid: Secondary | ICD-10-CM | POA: Diagnosis not present

## 2022-04-27 DIAGNOSIS — H25813 Combined forms of age-related cataract, bilateral: Secondary | ICD-10-CM | POA: Diagnosis not present

## 2022-05-26 DIAGNOSIS — H25813 Combined forms of age-related cataract, bilateral: Secondary | ICD-10-CM | POA: Diagnosis not present

## 2022-05-26 DIAGNOSIS — H00012 Hordeolum externum right lower eyelid: Secondary | ICD-10-CM | POA: Diagnosis not present

## 2022-06-28 DIAGNOSIS — H25813 Combined forms of age-related cataract, bilateral: Secondary | ICD-10-CM | POA: Diagnosis not present

## 2022-06-28 DIAGNOSIS — H00012 Hordeolum externum right lower eyelid: Secondary | ICD-10-CM | POA: Diagnosis not present

## 2022-08-01 ENCOUNTER — Other Ambulatory Visit: Payer: Self-pay | Admitting: Family Medicine

## 2022-08-10 ENCOUNTER — Encounter: Payer: Self-pay | Admitting: Adult Health

## 2022-08-10 ENCOUNTER — Ambulatory Visit (INDEPENDENT_AMBULATORY_CARE_PROVIDER_SITE_OTHER): Payer: Medicare Other | Admitting: Adult Health

## 2022-08-10 VITALS — BP 124/78 | HR 88 | Temp 97.7°F | Resp 18 | Ht 68.5 in | Wt 210.0 lb

## 2022-08-10 DIAGNOSIS — L84 Corns and callosities: Secondary | ICD-10-CM

## 2022-08-10 DIAGNOSIS — H905 Unspecified sensorineural hearing loss: Secondary | ICD-10-CM | POA: Diagnosis not present

## 2022-08-10 DIAGNOSIS — R2242 Localized swelling, mass and lump, left lower limb: Secondary | ICD-10-CM

## 2022-08-10 DIAGNOSIS — H8109 Meniere's disease, unspecified ear: Secondary | ICD-10-CM

## 2022-08-10 NOTE — Progress Notes (Signed)
[facility] clinic  Provider:   Durenda Age  DNP  Code Status:  Full Code  Goals of Care:     04/25/2022    1:06 PM  Advanced Directives  Does Patient Have a Medical Advance Directive? Yes  Type of Paramedic of [place];Living will;Out of facility DNR (pink MOST or yellow form)  Does patient want to make changes to medical advance directive? No - Patient declined  Copy of [place] in Chart? No - copy requested  Would patient like information on creating a medical advance directive? No - Patient declined     Chief Complaint  Patient presents with   Acute Visit    Patient is here for a referral to get hearing aides    HPI: Patient is a 76 y.o. male seen today for an acute visit for referral to audiology. He has a PMH of chronic kidney disease stage III, Meniere's disease and hypertension. He was seen at an audiology clinic and had wax removed in the past. He has been wearing hearing aids for 15 years. His wife was concerned about his hearing. He had his current hearing aids for 4 years. He thinks that his hearing got worse and now wants to be referred to an audiology clinic for hearing test. No noted erythema nor drainage in both ears.   He has a scar with nodule on his left lower leg. He said that he was bitten by a spider and the area became red which later on became a nodule.   Past Medical History:  Diagnosis Date   CKD (chronic kidney disease), stage III (HCC)    HTN (hypertension)     No past surgical history on file.  Allergies  Allergen Reactions   Colchicine     Upset stomach     Outpatient Encounter Medications as of 08/10/2022  Medication Sig   allopurinol (ZYLOPRIM) 100 MG tablet TAKE 1 TABLET BY MOUTH DAILY   chlorthalidone (HYGROTON) 25 MG tablet Take 12.5 mg by mouth 3 (three) times a week.   doxycycline (VIBRA-TABS) 100 MG tablet Take 1 tablet (100 mg total) by mouth 2 (two) times daily.   FLUTICASONE  PROPIONATE NA Place into the nose daily.   irbesartan (AVAPRO) 75 MG tablet Take 75 mg by mouth daily.   loratadine (CLARITIN) 10 MG tablet Take 10 mg by mouth daily.   pravastatin (PRAVACHOL) 20 MG tablet Take 20 mg by mouth daily.   tamsulosin (FLOMAX) 0.4 MG CAPS capsule TAKE 1 CAPSULE BY MOUTH AT  BEDTIME   Vitamin D, Ergocalciferol, 50 MCG (2000 UT) CAPS Take by mouth daily.   No facility-administered encounter medications on file as of 08/10/2022.    Review of Systems:  Review of Systems  Constitutional:  Negative for activity change, appetite change and fever.  HENT:  Positive for hearing loss. Negative for ear discharge, ear pain and sore throat.   Eyes: Negative.   Cardiovascular:  Negative for chest pain and leg swelling.  Gastrointestinal:  Negative for abdominal distention, diarrhea and vomiting.  Genitourinary:  Negative for dysuria, frequency and urgency.  Skin:  Negative for color change.  Neurological:  Negative for dizziness and headaches.  Psychiatric/Behavioral:  Negative for behavioral problems and sleep disturbance. The patient is not nervous/anxious.     Health Maintenance  Topic Date Due   TETANUS/TDAP  Never done   Zoster Vaccines- Shingrix (1 of 2) Never done   Pneumonia Vaccine 55+ Years old (24 -  PCV) Never done   COVID-19 Vaccine (4 - Pfizer series) 10/29/2020   INFLUENZA VACCINE  07/05/2022   Hepatitis C Screening  Completed   HPV VACCINES  Aged Out   COLONOSCOPY (Pts 45-65yr Insurance coverage will need to be confirmed)  Discontinued    Physical Exam: Vitals:   08/10/22 1509  BP: 124/78  Pulse: 88  Resp: 18  Temp: 97.7 F (36.5 C)  SpO2: 97%  Weight: 210 lb (95.3 kg)  Height: 5' 8.5" (1.74 m)   Body mass index is 31.47 kg/m. Physical Exam Constitutional:      General: He is not in acute distress.    Appearance: He is obese.  HENT:     Head: Normocephalic and atraumatic.     Mouth/Throat:     Mouth: Mucous membranes are moist.  Eyes:      Conjunctiva/sclera: Conjunctivae normal.  Cardiovascular:     Rate and Rhythm: Normal rate and regular rhythm.     Pulses: Normal pulses.     Heart sounds: Normal heart sounds.  Pulmonary:     Effort: Pulmonary effort is normal.     Breath sounds: Normal breath sounds.  Abdominal:     General: Bowel sounds are normal.     Palpations: Abdomen is soft.  Musculoskeletal:        General: No swelling. Normal range of motion.     Cervical back: Normal range of motion.  Skin:    General: Skin is warm and dry.     Comments: Left lower leg with nodule.  Neurological:     General: No focal deficit present.     Mental Status: He is alert and oriented to person, place, and time.  Psychiatric:        Mood and Affect: Mood normal.        Behavior: Behavior normal.        Thought Content: Thought content normal.        Judgment: Judgment normal.     Labs reviewed: Basic Metabolic Panel: Recent Labs    10/27/21 0828  NA 140  K 4.3  CL 102  CO2 29  GLUCOSE 98  BUN 24  CREATININE 1.37*  CALCIUM 9.5   Liver Function Tests: Recent Labs    10/27/21 0828  AST 32  ALT 45  BILITOT 0.8  PROT 6.9   No results for input(s): "LIPASE", "AMYLASE" in the last 8760 hours. No results for input(s): "AMMONIA" in the last 8760 hours. CBC: Recent Labs    10/27/21 0828  WBC 5.8  NEUTROABS 2,941  HGB 16.9  HCT 51.3*  MCV 90.2  PLT 145   Lipid Panel: Recent Labs    10/27/21 0828  CHOL 161  HDL 52  LDLCALC 88  TRIG 116  CHOLHDL 3.1   No results found for: "HGBA1C"  Procedures since last visit: No results found.  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 Nodule of skin of left lower leg -  was bitten by a spider and area became red then formed a nodule which is hard - Ambulatory referral to Dermatology   Labs/tests ordered:  None  Next appt:  as needed

## 2022-08-10 NOTE — Patient Instructions (Signed)
Hearing Loss Hearing loss is a partial or total loss of the ability to hear. This can be temporary or permanent, and it can happen in one or both ears. There are two types of hearing loss. You can have just one type or both types. You may have a problem with: Damage to your hearing nerves (sensorineural hearing loss). This type of hearing loss is more likely to be permanent. A hearing aid is often the best treatment. Sound getting to your inner ear (conductive hearing loss). This type of hearing loss can usually be treated medically or surgically. Medical care is necessary to treat hearing loss properly and to prevent the condition from getting worse. Your hearing may partially or completely come back, depending on what caused your hearing loss and how severe it is. In some cases, hearing loss is permanent. What are the causes? Common causes of hearing loss include: Too much wax in the ear canal. Infection of the ear canal or middle ear. Fluid in the middle ear. Injury to the ear or surrounding area. An object stuck in the ear. A history of prolonged exposure to loud sounds, such as music. Less common causes of hearing loss include: Tumors in the ear. Viral or bacterial infections, such as meningitis. A hole in the eardrum (perforated eardrum). Problems with the hearing nerve that sends signals between the brain and the ear. Certain medicines. What are the signs or symptoms? Symptoms of this condition may include: Difficulty telling the difference between sounds. Difficulty following a conversation when there is background noise. Lack of response to sounds in your environment. This may be most noticeable when you do not respond to startling sounds. Needing to turn up the volume on the television, radio, or other devices. Ringing in the ears. Dizziness. How is this diagnosed? This condition is diagnosed based on: A physical exam. A hearing test (audiometry). The test will be performed  by a hearing specialist (audiologist). Imaging tests, such as an MRI or CT scan. You may also be referred to an ear, nose, and throat (ENT) specialist (otolaryngologist). How is this treated? Treatment for hearing loss may include: Earwax removal. Medicines to treat or prevent infection (antibiotics). Medicines to reduce inflammation (corticosteroids). Hearing aids or assistive listening devices. Follow these instructions at home: If you were prescribed an antibiotic medicine, take it as told by your health care provider. Do not stop taking the antibiotic even if you start to feel better. Take over-the-counter and prescription medicines only as told by your health care provider. Avoid loud noises. Use hearing protection if you are exposed to loud noises or work in a noisy environment. Return to your normal activities as told by your health care provider. Ask your health care provider what activities are safe for you. Keep all follow-up visits. This is important. Contact a health care provider if: You feel dizzy. You develop new symptoms. You vomit or feel nauseous. You have a fever. Get help right away if: You develop sudden changes in your vision. You have severe ear pain. You have new or increased weakness. You have a severe headache. Summary Hearing loss is a decreased ability to hear sounds around you. It can be temporary or permanent. Treatment will depend on the cause of your hearing loss. It may include earwax removal, medicines, or a hearing aid. Your hearing may partially or completely come back, depending on what caused your hearing loss and how severe it is. Keep all follow-up visits. This is important. This information is   not intended to replace advice given to you by your health care provider. Make sure you discuss any questions you have with your health care provider. Document Revised: 09/30/2021 Document Reviewed: 09/30/2021 Elsevier Patient Education  2023 [employer]  

## 2022-08-11 DIAGNOSIS — H903 Sensorineural hearing loss, bilateral: Secondary | ICD-10-CM | POA: Diagnosis not present

## 2022-08-19 DIAGNOSIS — H903 Sensorineural hearing loss, bilateral: Secondary | ICD-10-CM | POA: Diagnosis not present

## 2022-09-05 DIAGNOSIS — Z23 Encounter for immunization: Secondary | ICD-10-CM | POA: Diagnosis not present

## 2022-09-14 DIAGNOSIS — J301 Allergic rhinitis due to pollen: Secondary | ICD-10-CM | POA: Diagnosis not present

## 2022-09-19 DIAGNOSIS — Z23 Encounter for immunization: Secondary | ICD-10-CM | POA: Diagnosis not present

## 2022-10-14 DIAGNOSIS — J301 Allergic rhinitis due to pollen: Secondary | ICD-10-CM | POA: Diagnosis not present

## 2022-11-15 ENCOUNTER — Encounter: Payer: Self-pay | Admitting: Family

## 2022-11-15 ENCOUNTER — Ambulatory Visit (INDEPENDENT_AMBULATORY_CARE_PROVIDER_SITE_OTHER): Payer: Medicare Other | Admitting: Family

## 2022-11-15 VITALS — BP 120/84 | HR 61 | Temp 97.7°F | Resp 16 | Ht 68.5 in | Wt 212.2 lb

## 2022-11-15 DIAGNOSIS — H04123 Dry eye syndrome of bilateral lacrimal glands: Secondary | ICD-10-CM

## 2022-11-15 DIAGNOSIS — T7840XD Allergy, unspecified, subsequent encounter: Secondary | ICD-10-CM | POA: Diagnosis not present

## 2022-11-15 MED ORDER — ARTIFICIAL TEARS OPHTHALMIC OINT: TOPICAL_OINTMENT | OPHTHALMIC | 3 refills | Status: AC | PRN

## 2022-11-15 NOTE — Patient Instructions (Signed)
-   use Zyrtec one by mouth daily x 7 days  - apply artificial tears three time daily as needed for dry eyes

## 2022-11-15 NOTE — Progress Notes (Signed)
Provider: Muranda Coye FNP-C  [name], MD  Patient Care Team: [name], MD as PCP - General (Family Medicine)  Extended Emergency Contact Information Primary Emergency Contact: Wendie Agreste Address: [address]          [place], [place] [address] Johnnette Litter of [place] Phone: [phone] Relation: Spouse Secondary Emergency Contact: Reinaldo Raddle [place] of [place] Mobile Phone: [phone] Relation: Daughter  Code Status:  Full Code  Goals of care: Advanced Directive information    11/15/2022    2:41 PM  Advanced Directives  Does Patient Have a Medical Advance Directive? Yes  Type of Paramedic of [place];Living will;Out of facility DNR (pink MOST or yellow form)  Does patient want to make changes to medical advance directive? No - Patient declined  Copy of [place] in Chart? No - copy requested     Chief Complaint  Patient presents with   Acute Visit    Patient complains of severe allergies. Patient symptoms are itchy/watery eyes, head congestion, and throat irritation.     HPI:  Pt is a 76 y.o. male seen today for an acute visit for evaluation of itchy watery eyes and throat irritation.sinus is stuffed up. He denies any fever,chills,cough,fatigue,body aches,runny nose,chest tightness,chest pain,palpitation or shortness of breath.    Past Medical History:  Diagnosis Date   CKD (chronic kidney disease), stage III (HCC)    HTN (hypertension)    History reviewed. No pertinent surgical history.  Allergies  Allergen Reactions   Colchicine     Upset stomach     Outpatient Encounter Medications as of 11/15/2022  Medication Sig   allopurinol (ZYLOPRIM) 100 MG tablet TAKE 1 TABLET BY MOUTH DAILY   Azelastine HCl 137 MCG/SPRAY SOLN Place 2 sprays into both nostrils daily.   chlorthalidone (HYGROTON) 25 MG tablet Take 12.5 mg by mouth 3 (three) times a week.   FLUTICASONE  PROPIONATE NA Place into the nose daily.   irbesartan (AVAPRO) 75 MG tablet Take 75 mg by mouth daily.   loratadine (CLARITIN) 10 MG tablet Take 10 mg by mouth daily.   pravastatin (PRAVACHOL) 20 MG tablet Take 20 mg by mouth daily.   tamsulosin (FLOMAX) 0.4 MG CAPS capsule TAKE 1 CAPSULE BY MOUTH AT  BEDTIME   Vitamin D, Ergocalciferol, 50 MCG (2000 UT) CAPS Take by mouth daily.   [DISCONTINUED] doxycycline (VIBRA-TABS) 100 MG tablet Take 1 tablet (100 mg total) by mouth 2 (two) times daily.   No facility-administered encounter medications on file as of 11/15/2022.    Review of Systems  Constitutional:  Negative for appetite change, chills, fatigue and fever.  HENT:  Positive for congestion and sore throat. Negative for postnasal drip, rhinorrhea, sinus pressure, sinus pain, sneezing, tinnitus and trouble swallowing.   Eyes:  Positive for itching. Negative for pain, discharge, redness and visual disturbance.       Watery eyes  Respiratory:  Negative for cough, chest tightness, shortness of breath and wheezing.   Cardiovascular:  Negative for chest pain, palpitations and leg swelling.  Gastrointestinal:  Negative for abdominal distention, abdominal pain, constipation, diarrhea, nausea and vomiting.  Skin:  Negative for color change, pallor and rash.  Neurological:  Negative for dizziness, syncope, light-headedness and headaches.    Immunization History  Administered Date(s) Administered   Fluad Quad(high Dose 65+) 09/19/2022   Influenza, High Dose Seasonal PF 10/01/2018, 09/05/2019   Influenza-Unspecified 09/05/2019, 09/22/2020   PFIZER(Purple Top)SARS-COV-2 Vaccination 12/29/2019,  01/20/2020, 09/03/2020   Respiratory Syncytial Virus Vaccine,Recomb Aduvanted(Arexvy) 10/20/2022   Unspecified SARS-COV-2 Vaccination 09/05/2022   Pertinent  Health Maintenance Due  Topic Date Due   INFLUENZA VACCINE  Completed   COLONOSCOPY (Pts 45-58yr Insurance coverage will need to be confirmed)   Discontinued      08/08/2021   10:46 AM 08/10/2021   12:59 PM 11/04/2021    8:56 AM 04/25/2022    1:06 PM 11/15/2022    2:41 PM  Fall Risk  Falls in the past year?  0 0 0 0  Was there an injury with Fall?   0 0 0  Fall Risk Category Calculator   0 0 0  Fall Risk Category   Low Low Low  Patient Fall Risk Level Low fall risk Low fall risk Low fall risk Low fall risk Low fall risk  Patient at Risk for Falls Due to   No Fall Risks No Fall Risks No Fall Risks  Fall risk Follow up  Falls evaluation completed Falls evaluation completed;Education provided;Falls prevention discussed Falls evaluation completed Falls evaluation completed   Functional Status Survey:    Vitals:   11/15/22 1438  BP: 120/84  Pulse: 61  Resp: 16  Temp: 97.7 F (36.5 C)  SpO2: 98%  Weight: 212 lb 3.2 oz (96.3 kg)  Height: 5' 8.5" (1.74 m)   Body mass index is 31.8 kg/m. Physical Exam Vitals reviewed.  Constitutional:      General: He is not in acute distress.    Appearance: Normal appearance. He is obese. He is not ill-appearing or diaphoretic.  HENT:     Head: Normocephalic.     Right Ear: Tympanic membrane, ear canal and external ear normal. There is no impacted cerumen.     Left Ear: Tympanic membrane, ear canal and external ear normal. There is no impacted cerumen.     Nose: Nose normal. No congestion or rhinorrhea.     Mouth/Throat:     Mouth: Mucous membranes are moist.     Pharynx: Oropharynx is clear. No oropharyngeal exudate or posterior oropharyngeal erythema.  Eyes:     General: No scleral icterus.       Right eye: No discharge.        Left eye: No discharge.     Extraocular Movements: Extraocular movements intact.     Conjunctiva/sclera: Conjunctivae normal.     Pupils: Pupils are equal, round, and reactive to light.  Neck:     Vascular: No carotid bruit.  Cardiovascular:     Rate and Rhythm: Normal rate and regular rhythm.     Pulses: Normal pulses.     Heart sounds: Normal heart  sounds. No murmur heard.    No friction rub. No gallop.  Pulmonary:     Effort: Pulmonary effort is normal. No respiratory distress.     Breath sounds: Normal breath sounds. No wheezing, rhonchi or rales.  Chest:     Chest wall: No tenderness.  Musculoskeletal:     Cervical back: Normal range of motion. No rigidity or tenderness.  Lymphadenopathy:     Cervical: No cervical adenopathy.  Skin:    General: Skin is warm and dry.     Coloration: Skin is not pale.     Findings: No erythema or rash.  Neurological:     Mental Status: He is alert.  Psychiatric:        Mood and Affect: Mood normal.        Speech: Speech normal.  Behavior: Behavior normal.     Labs reviewed: No results for input(s): "NA", "K", "CL", "CO2", "GLUCOSE", "BUN", "CREATININE", "CALCIUM", "MG", "PHOS" in the last 8760 hours. No results for input(s): "AST", "ALT", "ALKPHOS", "BILITOT", "PROT", "ALBUMIN" in the last 8760 hours. No results for input(s): "WBC", "NEUTROABS", "HGB", "HCT", "MCV", "PLT" in the last 8760 hours. No results found for: "TSH" No results found for: "HGBA1C" Lab Results  Component Value Date   CHOL 161 10/27/2021   HDL 52 10/27/2021   LDLCALC 88 10/27/2021   TRIG 116 10/27/2021   CHOLHDL 3.1 10/27/2021    Significant Diagnostic Results in last 30 days:  No results found.  Assessment/Plan 1. Dry eyes, bilateral Negative exam findings  - artificial tears (LACRILUBE) OINT ophthalmic ointment; Place into both eyes every 3 (three) hours as needed for dry eyes.  Dispense: 5 g; Refill: 3  2. Allergy, subsequent encounter OTC antihistamine   Family/ staff Communication: Reviewed plan of care with patient verbalized understanding   Labs/tests ordered: None   Next Appointment: Return if symptoms worsen or fail to improve.  Sandrea Hughs, NP

## 2022-12-27 ENCOUNTER — Ambulatory Visit (INDEPENDENT_AMBULATORY_CARE_PROVIDER_SITE_OTHER): Payer: Medicare Other | Admitting: Allergy & Immunology

## 2022-12-27 ENCOUNTER — Encounter: Payer: Self-pay | Admitting: Allergy & Immunology

## 2022-12-27 VITALS — BP 114/66 | HR 95 | Temp 97.7°F | Resp 18 | Ht 68.5 in | Wt 206.8 lb

## 2022-12-27 DIAGNOSIS — J302 Other seasonal allergic rhinitis: Secondary | ICD-10-CM

## 2022-12-27 DIAGNOSIS — J3089 Other allergic rhinitis: Secondary | ICD-10-CM

## 2022-12-27 DIAGNOSIS — H9193 Unspecified hearing loss, bilateral: Secondary | ICD-10-CM

## 2022-12-27 MED ORDER — MONTELUKAST SODIUM 10 MG PO TABS: 10.0000 mg | ORAL_TABLET | Freq: Every day | ORAL | 5 refills | Status: DC

## 2022-12-27 MED ORDER — AZELASTINE-FLUTICASONE 137-50 MCG/ACT NA SUSP: 2.0000 | NASAL | 5 refills | Status: DC

## 2022-12-27 NOTE — Patient Instructions (Addendum)
1. Seasonal and perennial allergic rhinitis - Testing today showed: grasses, ragweed, weeds, trees, and indoor molds. - Copy of test results provided.  - Avoidance measures provided. - Continue with: Claritin (loratadine) '10mg'$  tablet once daily, Flonase (fluticasone) one spray per nostril daily (AIM FOR EAR ON EACH SIDE), and Astelin (azelastine) 2 sprays per nostril 1-2 times daily as needed - Start taking: Singulair (montelukast) '10mg'$  daily - Singulair can cause irritability and bad dreams, so beware of this and stop the medication if this happens.  - We are going to try to get a combined fluticasone and azelastine nasal spray approved.  - In the meantime, at least use the nose sprays during the growing season (March through November).  - You can use an extra dose of the antihistamine, if needed, for breakthrough symptoms.  - Consider nasal saline rinses 1-2 times daily to remove allergens from the nasal cavities as well as help with mucous clearance (this is especially helpful to do before the nasal sprays are given) - Consider allergy shots as a means of long-term control. - Allergy shots "re-train" and "reset" the immune system to ignore environmental allergens and decrease the resulting immune response to those allergens (sneezing, itchy watery eyes, runny nose, nasal congestion, etc).    - Allergy shots improve symptoms in 75-85% of patients.  - We can discuss more at the next appointment if the medications are not working for you.  2. Return in about 2 months (around 02/25/2023).    Please inform us of any Emergency Department visits, hospitalizations, or changes in symptoms. Call us before going to the ED for breathing or allergy symptoms since we might be able to fit you in for a sick visit. Feel free to contact us anytime with any questions, problems, or concerns.  It was a pleasure to meet you and your bride today!  Websites that have reliable patient information: 1. American  Academy of Asthma, Allergy, and Immunology: www.aaaai.org 2. Food Allergy Research and Education (FARE): foodallergy.org 3. Mothers of Asthmatics: http://www.asthmacommunitynetwork.org 4. American College of Allergy, Asthma, and Immunology: www.acaai.org   COVID-19 Vaccine Information can be found at: [email or web address].uk For questions related to vaccine distribution or appointments, please email [email or web address] or call [phone].   We realize that you might be concerned about having an allergic reaction to the COVID19 vaccines. To help with that concern, WE ARE OFFERING THE COVID19 VACCINES IN OUR OFFICE! Ask the front desk for dates!     "Like" [place] on Facebook and Instagram for our latest updates!      A healthy democracy works best when [employer] participate! Make sure you are registered to vote! If you have moved or changed any of your contact information, you will need to get this updated before voting!  In some cases, you MAY be able to register to vote online: [email or web address]      Airborne Adult Perc - 12/27/22 0911     Time Antigen Placed 0911    Allergen Manufacturer Lavella Hammock    Location Back    Number of Test 59    Panel 1 Select    1. Control-Buffer 50% Glycerol Negative    2. Control-Histamine 1 mg/ml 2+    3. Albumin saline Negative    4. Olivet Negative    5. [place] Negative    6. Johnson Negative    7. S[place] Blue Negative    8. Meadow Fescue Negative    9. Perennial Rye Negative    10. Sweet Vernal  Negative    11. Timothy Negative    12. Cocklebur Negative    13. Burweed Marshelder Negative    14. Ragweed, short Negative    15. Ragweed, Giant Negative    16. Plantain,  English Negative    17. Lamb's Quarters Negative    18. Sheep Sorrell Negative    19. Rough Pigweed Negative    20. Marsh Elder, Rough Negative    21. Mugwort, Common Negative    22. Ash  mix Negative    23. Birch mix Negative    24. Beech American Negative    25. Box, Elder Negative    26. Cedar, red Negative    27. Cottonwood, [place] Negative    28. Elm mix Negative    29. Hickory Negative    30. Maple mix Negative    31. Oak, [place] mix Negative    32. Pecan Pollen Negative    33. Pine mix Negative    34. Sycamore Eastern Negative    35. Oak Grove, Black Pollen Negative    36. Alternaria alternata Negative    37. Cladosporium Herbarum Negative    38. Aspergillus mix Negative    39. Penicillium mix Negative    40. Bipolaris sorokiniana (Helminthosporium) Negative    41. Drechslera spicifera (Curvularia) Negative    42. Mucor plumbeus Negative    43. Fusarium moniliforme Negative    44. Aureobasidium pullulans (pullulara) Negative    45. Rhizopus oryzae Negative    46. Botrytis cinera Negative    47. Epicoccum nigrum Negative    48. Phoma betae Negative    49. Candida Albicans Negative    50. Trichophyton mentagrophytes Negative    51. Mite, D Farinae  5,000 AU/ml Negative    52. Mite, D Pteronyssinus  5,000 AU/ml Negative    53. Cat Hair 10,000 BAU/ml Negative    54.  Dog Epithelia Negative    55. Mixed Feathers Negative    56. Horse Epithelia Negative    57. Cockroach, German Negative    58. Mouse Negative    59. Tobacco Leaf Negative             Intradermal - 12/27/22 1006     Time Antigen Placed 1006    Allergen Manufacturer Lavella Hammock    Location Arm    Number of Test 15    Intradermal Select             Reducing Pollen Exposure  The American Academy of Allergy, Asthma and Immunology suggests the following steps to reduce your exposure to pollen during allergy seasons.    Do not hang sheets or clothing out to dry; pollen may collect on these items. Do not mow lawns or spend time around freshly cut grass; mowing stirs up pollen. Keep windows closed at night.  Keep car windows closed while driving. Minimize morning activities outdoors, a  time when pollen counts are usually at their highest. Stay indoors as much as possible when pollen counts or humidity is high and on windy days when pollen tends to remain in the air longer. Use air conditioning when possible.  Many air conditioners have filters that trap the pollen spores. Use a HEPA room air filter to remove pollen form the indoor air you breathe.  Control of Mold Allergen   Mold and fungi can grow on a variety of surfaces provided certain temperature and moisture conditions exist.  Outdoor molds grow on plants, decaying vegetation and soil.  The major outdoor mold, Alternaria  and Cladosporium, are found in very high numbers during hot and dry conditions.  Generally, a late Summer - Fall peak is seen for common outdoor fungal spores.  Rain will temporarily lower outdoor mold spore count, but counts rise rapidly when the rainy period ends.  The most important indoor molds are Aspergillus and Penicillium.  Dark, humid and poorly ventilated basements are ideal sites for mold growth.  The next most common sites of mold growth are the bathroom and the kitchen.    Indoor (Perennial) Mold Control   Positive indoor molds via skin testing: Aspergillus, Penicillium, Fusarium, Aureobasidium (Pullulara), and Rhizopus  Maintain humidity below 50%. Clean washable surfaces with 5% bleach solution. Remove sources e.g. contaminated carpets.    Allergy Shots   Allergies are the result of a chain reaction that starts in the immune system. Your immune system controls how your body defends itself. For instance, if you have an allergy to pollen, your immune system identifies pollen as an invader or allergen. Your immune system overreacts by producing antibodies called Immunoglobulin E (IgE). These antibodies travel to cells that release chemicals, causing an allergic reaction.  The concept behind allergy immunotherapy, whether it is received in the form of shots or tablets, is that the immune  system can be desensitized to specific allergens that trigger allergy symptoms. Although it requires time and patience, the payback can be long-term relief.  How Do Allergy Shots Work?  Allergy shots work much like a vaccine. Your body responds to injected amounts of a particular allergen given in increasing doses, eventually developing a resistance and tolerance to it. Allergy shots can lead to decreased, minimal or no allergy symptoms.  There generally are two phases: build-up and maintenance. Build-up often ranges from three to six months and involves receiving injections with increasing amounts of the allergens. The shots are typically given once or twice a week, though more rapid build-up schedules are sometimes used.  The maintenance phase begins when the most effective dose is reached. This dose is different for each person, depending on how allergic you are and your response to the build-up injections. Once the maintenance dose is reached, there are longer periods between injections, typically two to four weeks.  Occasionally doctors give cortisone-type shots that can temporarily reduce allergy symptoms. These types of shots are different and should not be confused with allergy immunotherapy shots.  Who Can Be Treated with Allergy Shots?  Allergy shots may be a good treatment approach for people with allergic rhinitis (hay fever), allergic asthma, conjunctivitis (eye allergy) or stinging insect allergy.   Before deciding to begin allergy shots, you should consider:   The length of allergy season and the severity of your symptoms  Whether medications and/or changes to your environment can control your symptoms  Your desire to avoid long-term medication use  Time: allergy immunotherapy requires a major time commitment  Cost: may vary depending on your insurance coverage  Allergy shots for children age 35 and older are effective and often well tolerated. They might prevent the onset of  new allergen sensitivities or the progression to asthma.  Allergy shots are not started on patients who are pregnant but can be continued on patients who become pregnant while receiving them. In some patients with other medical conditions or who take certain common medications, allergy shots may be of risk. It is important to mention other medications you talk to your allergist.   When Will I Feel Better?  Some may experience decreased allergy  symptoms during the build-up phase. For others, it may take as long as 12 months on the maintenance dose. If there is no improvement after a year of maintenance, your allergist will discuss other treatment options with you.  If you aren't responding to allergy shots, it may be because there is not enough dose of the allergen in your vaccine or there are missing allergens that were not identified during your allergy testing. Other reasons could be that there are high levels of the allergen in your environment or major exposure to non-allergic triggers like tobacco smoke.  What Is the Length of Treatment?  Once the maintenance dose is reached, allergy shots are generally continued for three to five years. The decision to stop should be discussed with your allergist at that time. Some people may experience a permanent reduction of allergy symptoms. Others may relapse and a longer course of allergy shots can be considered.  What Are the Possible Reactions?  The two types of adverse reactions that can occur with allergy shots are local and systemic. Common local reactions include very mild redness and swelling at the injection site, which can happen immediately or several hours after. A systemic reaction, which is less common, affects the entire body or a particular body system. They are usually mild and typically respond quickly to medications. Signs include increased allergy symptoms such as sneezing, a stuffy nose or hives.  Rarely, a serious systemic reaction  called anaphylaxis can develop. Symptoms include swelling in the throat, wheezing, a feeling of tightness in the chest, nausea or dizziness. Most serious systemic reactions develop within 30 minutes of allergy shots. This is why it is strongly recommended you wait in your doctor's office for 30 minutes after your injections. Your allergist is trained to watch for reactions, and his or her staff is trained and equipped with the proper medications to identify and treat them.  Who Should Administer Allergy Shots?  The preferred location for receiving shots is your prescribing allergist's office. Injections can sometimes be given at another facility where the physician and staff are trained to recognize and treat reactions, and have received instructions by your prescribing allergist.

## 2022-12-27 NOTE — Progress Notes (Signed)
NEW PATIENT  Date of Service/Encounter:  12/27/22  Consult requested by: [name], MD   Assessment:   Seasonal and perennial allergic rhinitis (grasses, ragweed, weeds, trees, and indoor molds_  Bilateral hearing loss  Plan/Recommendations:   1. Seasonal and perennial allergic rhinitis - Testing today showed: grasses, ragweed, weeds, trees, and indoor molds. - Copy of test results provided.  - Avoidance measures provided. - Continue with: Claritin (loratadine) '10mg'$  tablet once daily, Flonase (fluticasone) one spray per nostril daily (AIM FOR EAR ON EACH SIDE), and Astelin (azelastine) 2 sprays per nostril 1-2 times daily as needed - Start taking: Singulair (montelukast) '10mg'$  daily - Singulair can cause irritability and bad dreams, so beware of this and stop the medication if this happens.  - We are going to try to get a combined fluticasone and azelastine nasal spray approved.  - In the meantime, at least use the nose sprays during the growing season (March through November).  - You can use an extra dose of the antihistamine, if needed, for breakthrough symptoms.  - Consider nasal saline rinses 1-2 times daily to remove allergens from the nasal cavities as well as help with mucous clearance (this is especially helpful to do before the nasal sprays are given) - Consider allergy shots as a means of long-term control. - Allergy shots "re-train" and "reset" the immune system to ignore environmental allergens and decrease the resulting immune response to those allergens (sneezing, itchy watery eyes, runny nose, nasal congestion, etc).    - Allergy shots improve symptoms in 75-85% of patients.  - We can discuss more at the next appointment if the medications are not working for you.  2. Return in about 2 months (around 02/25/2023).     This note in its entirety was forwarded to the Provider who requested this consultation.  Subjective:   Andre [name] is a 77 y.o. 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  Andre [name] was referred by [name], MD.     Andre [name] is a 77 y.o. male presenting for an evaluation of environmental allergies . They moved here from [place] 3 years ago. They bought a house initially and lived there for 2 years.  However, they have now downgraded into a townhome.   He apparently has a history of hearing loss.  He has had a hearing aid for approximately 8 years.  This worked well until the last year when his hearing got worse.  He apparently went to see an otolaryngologist for it was recommended that he get a cochlear implant (Dr. Perfecto Kingdom.  e).  At that visit, with PENTA, he also had some allergy testing performed.  Unclear what exactly was done.  He describes some needles in his arm, but the result sheet is not very detailed.  According to the sheet, he was reactive to [place] grass, hickory, ragweed, planting, goldenrod, and Alternaria.  He has never had traditional allergy testing done  by a board-certified allergist.  He never had any of these problems when he lived in [place].  This is all new to him.  He denies any new exposures.  They have a cat, but they have pet cats for over 50 years.  He currently lives in a house that was built on this lab.  He was previously living in the house with a crawlspace.  He denies any known mold exposures.  He has never had a sinus CT or any other imaging.  He  does not get sinus infections often at all.  He currently is on fluticasone which he gets from [facility] as well as loratadine that he gets from [employer].  He was started on azelastine as well.  He is not sure that it works any better than the fluticasone.  He did try Zyrtec for a week and that did not do any better than loratadine.  He has never been on Singulair and has never been on allergy shots.  Otherwise, there is no history of other atopic diseases, including asthma, food allergies, drug allergies, stinging insect allergies, eczema, urticaria, or contact dermatitis. There is no significant infectious history. Vaccinations are up to date.    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 If you have any questions, ask your nurse or doctor.          allopurinol 100 MG tablet Commonly known as: ZYLOPRIM TAKE 1 TABLET BY MOUTH DAILY   artificial tears Oint ophthalmic ointment Commonly known as: LACRILUBE Place into both eyes every 3 (three) hours as needed for dry eyes.   Azelastine HCl 137 MCG/SPRAY Soln Place 2 sprays into both nostrils daily.   Azelastine-Fluticasone 137-50 MCG/ACT Susp Commonly known as: Dymista Place 2 sprays into both nostrils 1 day or 1 dose. Started by: Valentina Shaggy, MD   chlorthalidone 25 MG tablet Commonly known as: HYGROTON Take 12.5 mg by mouth 3 (three) times a week.   FLUTICASONE PROPIONATE NA Place into the nose daily.   irbesartan 75 MG tablet Commonly  known as: AVAPRO Take 75 mg by mouth daily.   loratadine 10 MG tablet Commonly known as: CLARITIN Take 10 mg by mouth daily.   montelukast 10 MG tablet Commonly known as: Singulair Take 1 tablet (10 mg total) by mouth at bedtime. Started by: Valentina Shaggy, MD   pravastatin 20 MG tablet Commonly known as: PRAVACHOL Take 20 mg by mouth daily.   tamsulosin 0.4 MG Caps capsule Commonly known as: FLOMAX TAKE 1 CAPSULE BY MOUTH AT  BEDTIME   Vitamin D (Ergocalciferol) 50 MCG (2000 UT) Caps Take by mouth daily.        Birth History: non-contributory  Developmental History: non-contributory  Past Surgical History: History reviewed. No pertinent surgical history.   Family History: Family History  Problem Relation Age of Onset   Stroke Mother 43   Bronchiolitis Father 5  Social History: Andre [name] lives at home with his wife of 39 years (their anniversary is in August).  They live in a townhome that is 77 years old.  There is composite wood plank throughout the home.  They have cats inside of the home.  There are no dust mite covers on the bedding.  There is no tobacco exposure.  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  His wife worked as an Therapist, sports in [employer], site.  They have been retired for a couple of years.  There is no fume, chemical, or dust exposure.  He do not have HEPA filters in the home.  They do not live near an interstate or industrial area.   Review of Systems  Constitutional: Negative.  Negative for chills, fever, malaise/fatigue and weight loss.  HENT:  Positive for congestion and sinus pain. Negative for ear discharge and ear pain.   Eyes:  Negative for pain, discharge and redness.  Respiratory:  Negative for cough, sputum production, shortness of breath and wheezing.   Cardiovascular: Negative.  Negative for chest pain and palpitations.  Gastrointestinal:  Negative for abdominal pain, constipation, diarrhea, heartburn,  nausea and vomiting.  Skin: Negative.  Negative for itching and rash.  Neurological:  Negative for dizziness and headaches.  Endo/Heme/Allergies:  Positive for environmental allergies. Does not bruise/bleed easily.       Objective:   Blood pressure 114/66, pulse 95, temperature 97.7 F (36.5 C), temperature source Oral, resp. rate 18, height 5' 8.5" (1.74 m), weight 206 lb 12.8 oz (93.8 kg), SpO2 97 %. Body mass index is 30.99 kg/m.     Physical Exam Vitals reviewed.  Constitutional:      Appearance: He is well-developed.  HENT:     Head: Normocephalic and atraumatic.     Right Ear: Tympanic membrane, ear canal and external ear normal. No drainage, swelling or tenderness. Tympanic membrane is not injected, scarred, erythematous, retracted or bulging.     Left Ear: Tympanic membrane, ear canal and external ear normal. No drainage, swelling or tenderness. Tympanic membrane is not injected, scarred, erythematous, retracted or bulging.     Ears:     Comments: Hearing aids in place.  Hard of hearing.    Nose: No nasal deformity, septal deviation, mucosal edema or rhinorrhea.     Right Turbinates: Enlarged and swollen.     Left Turbinates: Enlarged and swollen.     Right Sinus: No maxillary sinus tenderness or frontal sinus tenderness.     Left Sinus: No maxillary sinus tenderness or frontal sinus tenderness.     Mouth/Throat:     Mouth: Mucous membranes are not pale and not dry.     Pharynx: Uvula midline.  Eyes:     General:        Right eye: No discharge.        Left eye: No discharge.     Conjunctiva/sclera: Conjunctivae normal.     Right eye: Right conjunctiva is not injected. No chemosis.    Left eye: Left conjunctiva is not injected. No chemosis.    Pupils: Pupils are equal, round, and reactive to light.  Cardiovascular:     Rate and Rhythm: Normal rate and regular rhythm.     Heart sounds: Normal heart sounds.  Pulmonary:     Effort: Pulmonary effort is normal. No  tachypnea, accessory muscle usage or respiratory distress.     Breath sounds: Normal breath sounds. No wheezing, rhonchi or rales.  Chest:  Chest wall: No tenderness.  Abdominal:     Tenderness: There is no abdominal tenderness. There is no guarding or rebound.  Lymphadenopathy:     Head:     Right side of head: No submandibular, tonsillar or occipital adenopathy.     Left side of head: No submandibular, tonsillar or occipital adenopathy.     Cervical: No cervical adenopathy.  Skin:    Coloration: Skin is not pale.     Findings: No abrasion, erythema, petechiae or rash. Rash is not papular, urticarial or vesicular.  Neurological:     Mental Status: He is alert.  Psychiatric:        Behavior: Behavior is cooperative.      Diagnostic studies:     Allergy Studies:     Airborne Adult Perc - 12/27/22 0911     Time Antigen Placed 0911    Allergen Manufacturer Lavella Hammock    Location Back    Number of Test 59    Panel 1 Select    1. Control-Buffer 50% Glycerol Negative    2. Control-Histamine 1 mg/ml 2+    3. Albumin saline Negative    4. [place] Negative    5. [place] Negative    6. Johnson Negative    7. [place] Blue Negative    8. Meadow Fescue Negative    9. Perennial Rye Negative    10. Sweet Vernal Negative    11. Timothy Negative    12. Cocklebur Negative    13. Burweed Marshelder Negative    14. Ragweed, short Negative    15. Ragweed, Giant Negative    16. Plantain,  English Negative    17. Lamb's Quarters Negative    18. Sheep Sorrell Negative    19. Rough Pigweed Negative    20. Marsh Elder, Rough Negative    21. Mugwort, Common Negative    22. Ash mix Negative    23. Birch mix Negative    24. Beech American Negative    25. Box, Elder Negative    26. Cedar, red Negative    27. Cottonwood, [place] Negative    28. Elm mix Negative    29. Hickory Negative    30. Maple mix Negative    31. Oak, [place] mix Negative    32. Pecan Pollen Negative    33. Pine  mix Negative    34. Sycamore Eastern Negative    35. Hampton, Black Pollen Negative    36. Alternaria alternata Negative    37. Cladosporium Herbarum Negative    38. Aspergillus mix Negative    39. Penicillium mix Negative    40. Bipolaris sorokiniana (Helminthosporium) Negative    41. Drechslera spicifera (Curvularia) Negative    42. Mucor plumbeus Negative    43. Fusarium moniliforme Negative    44. Aureobasidium pullulans (pullulara) Negative    45. Rhizopus oryzae Negative    46. Botrytis cinera Negative    47. Epicoccum nigrum Negative    48. Phoma betae Negative    49. Candida Albicans Negative    50. Trichophyton mentagrophytes Negative    51. Mite, D Farinae  5,000 AU/ml Negative    52. Mite, D Pteronyssinus  5,000 AU/ml Negative    53. Cat Hair 10,000 BAU/ml Negative    54.  Dog Epithelia Negative    55. Mixed Feathers Negative    56. Horse Epithelia Negative    57. Cockroach, German Negative    58. Mouse Negative    59. Tobacco Leaf Negative  Intradermal - 12/27/22 1006     Time Antigen Placed 1006    Allergen Manufacturer Lavella Hammock    Location Arm    Number of Test 15    Intradermal Select    Control Negative    [place] 3+    Johnson Negative    7 Grass 1+    Ragweed mix 3+    Weed mix 1+    Tree mix 1+    Mold 1 Negative    Mold 2 2+    Mold 3 Negative    Mold 4 2+    Cat Negative    Dog Negative    Cockroach Negative    Mite mix Negative             Allergy testing results were read and interpreted by myself, documented by clinical staff.         Salvatore Marvel, MD [facility] [place]

## 2023-01-04 ENCOUNTER — Other Ambulatory Visit: Payer: Self-pay | Admitting: *Deleted

## 2023-01-04 ENCOUNTER — Telehealth: Payer: Self-pay | Admitting: Allergy & Immunology

## 2023-01-04 MED ORDER — MONTELUKAST SODIUM 10 MG PO TABS: 10.0000 mg | ORAL_TABLET | Freq: Every day | ORAL | 5 refills | Status: DC

## 2023-01-04 NOTE — Telephone Encounter (Signed)
Patient called stating he was able to pick up his nasal spray from pharmacy. Patient was unable to pick up the montelukast and was told by pharmacy that it had never been received. Advised patient our office did send it in on 12-27-2022.   Patient states he called pharmacy and whover he spoke to advised him prescription wasn't sent in.   Patient is requesting for montelukast to be sent in to [facility] [id number]  Best contact number: [phone]

## 2023-01-04 NOTE — Telephone Encounter (Signed)
Resent medication back in to pharmacy. Called patient and advised, patient verbalized understanding.

## 2023-01-07 ENCOUNTER — Other Ambulatory Visit: Payer: Self-pay | Admitting: Family Medicine

## 2023-01-09 DIAGNOSIS — N1831 Chronic kidney disease, stage 3a: Secondary | ICD-10-CM | POA: Diagnosis not present

## 2023-01-09 LAB — BASIC METABOLIC PANEL
Creatinine: 1.4 — AB (ref 0.6–1.3)
Potassium: 4.3 mEq/L (ref 3.5–5.1)

## 2023-01-09 LAB — CBC AND DIFFERENTIAL: Hemoglobin: 16.5 (ref 13.5–17.5)

## 2023-01-09 LAB — VITAMIN D 25 HYDROXY (VIT D DEFICIENCY, FRACTURES): Vit D, 25-Hydroxy: 44

## 2023-01-09 LAB — COMPREHENSIVE METABOLIC PANEL: eGFR: 51

## 2023-01-18 DIAGNOSIS — N400 Enlarged prostate without lower urinary tract symptoms: Secondary | ICD-10-CM | POA: Diagnosis not present

## 2023-01-18 DIAGNOSIS — M109 Gout, unspecified: Secondary | ICD-10-CM | POA: Diagnosis not present

## 2023-01-18 DIAGNOSIS — N1831 Chronic kidney disease, stage 3a: Secondary | ICD-10-CM | POA: Diagnosis not present

## 2023-01-18 DIAGNOSIS — R809 Proteinuria, unspecified: Secondary | ICD-10-CM | POA: Diagnosis not present

## 2023-01-18 DIAGNOSIS — I129 Hypertensive chronic kidney disease with stage 1 through stage 4 chronic kidney disease, or unspecified chronic kidney disease: Secondary | ICD-10-CM | POA: Diagnosis not present

## 2023-01-31 ENCOUNTER — Encounter: Payer: Self-pay | Admitting: Family Medicine

## 2023-02-05 ENCOUNTER — Other Ambulatory Visit: Payer: Self-pay | Admitting: Family Medicine

## 2023-02-07 IMAGING — MR MR HEAD W/O CM
6 of 10 series · 29 of 48 positions shown · non-contrast
Comparison: Head CT performed earlier today 08/08/2021.

CLINICAL DATA: Neuro deficit, acute, stroke suspected. Additional
provided: History of hypertension and chronic kidney disease,
patient feeling off balance.

EXAM:
MRI HEAD WITHOUT CONTRAST
TECHNIQUE: Multiplanar, multiecho pulse sequences of the brain and surrounding
structures were obtained without intravenous contrast.

[Series 2: DWI · axial · 3.0mm · 0.94mm/px · z∈[-43,+110]mm · 9 of 104 slices shown (1 of 2)]
[im 1/104]
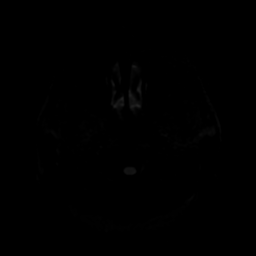
[im 13/104]
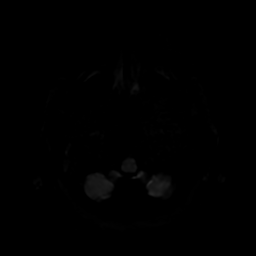
[im 26/104]
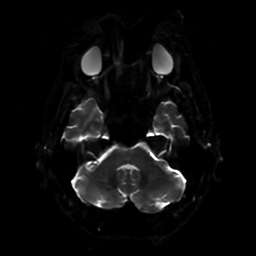
[im 39/104]
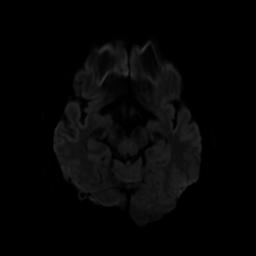
[im 52/104]
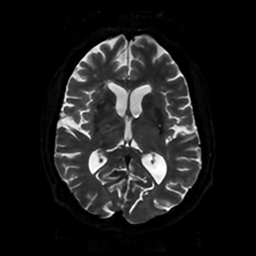
[im 65/104]
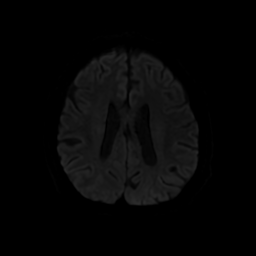
[im 78/104]
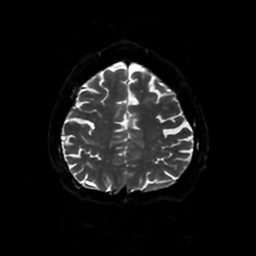
[im 91/104]
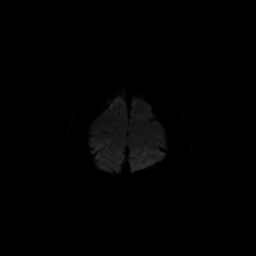
[im 104/104]
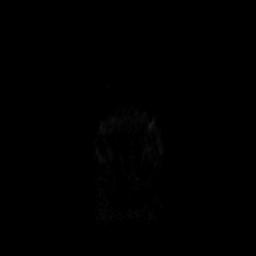

[Series 3: DWI · coronal · 4.0mm · 0.94mm/px · 7 of 75 slices shown (2 of 2)]
[im 1/75]
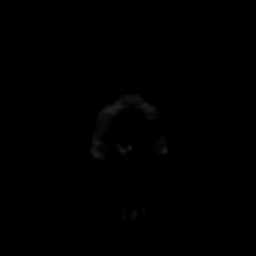
[im 13/75]
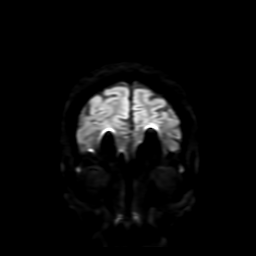
[im 25/75]
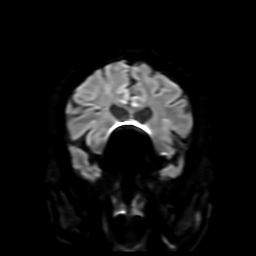
[im 38/75]
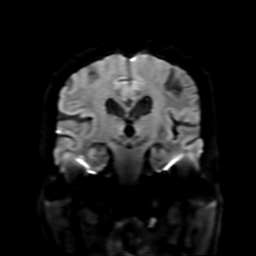
[im 50/75]
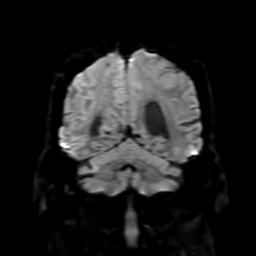
[im 62/75]
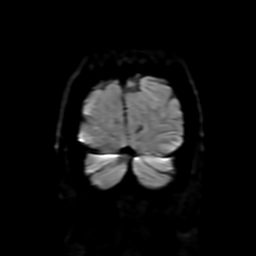
[im 75/75]
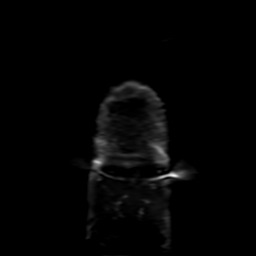

[Series 4: FLAIR · sagittal · 5.0mm · 0.23mm/px · 2 of 25 slices shown (1 of 2)]
[im 1/25]
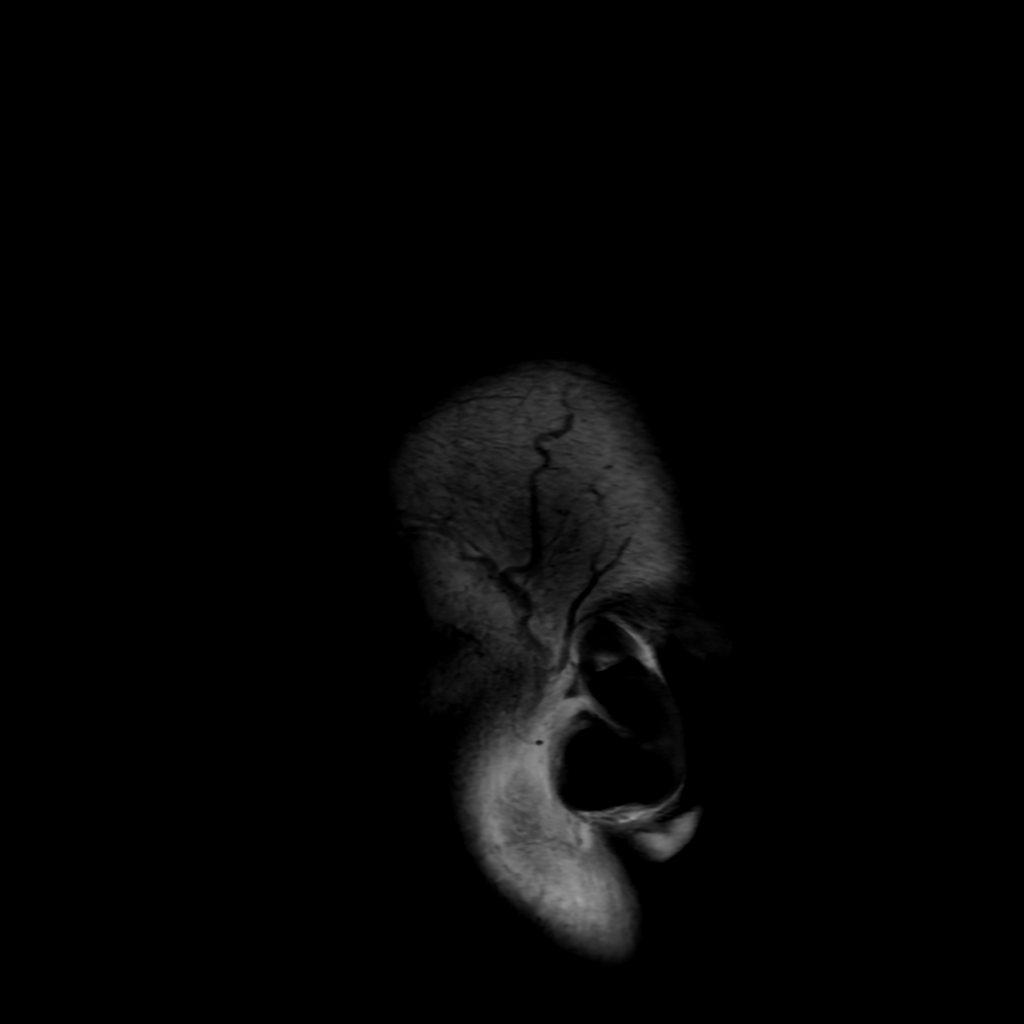
[im 25/25]
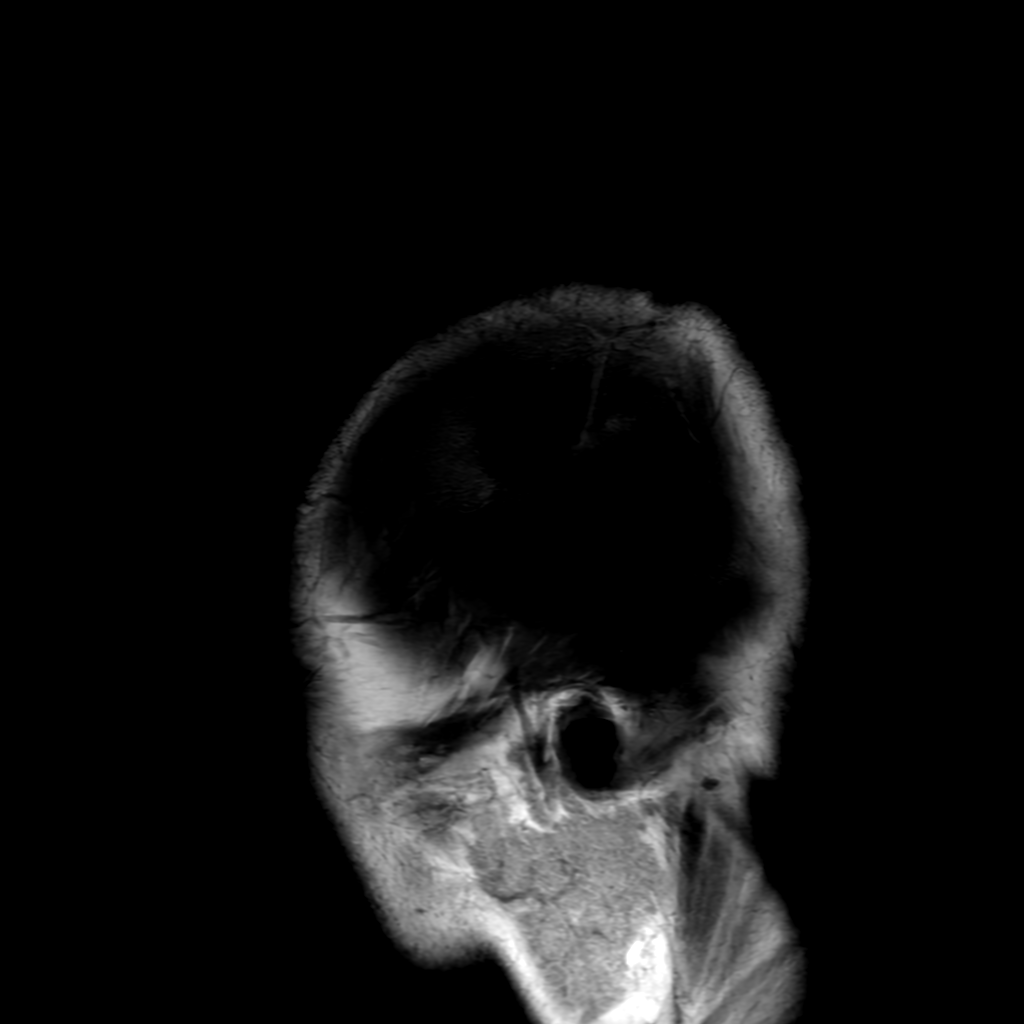

[Series 11: FLAIR · axial · 4.0mm · 0.45mm/px · z∈[-41,+108]mm · 3 of 35 slices shown (2 of 2)]
[im 1/35]
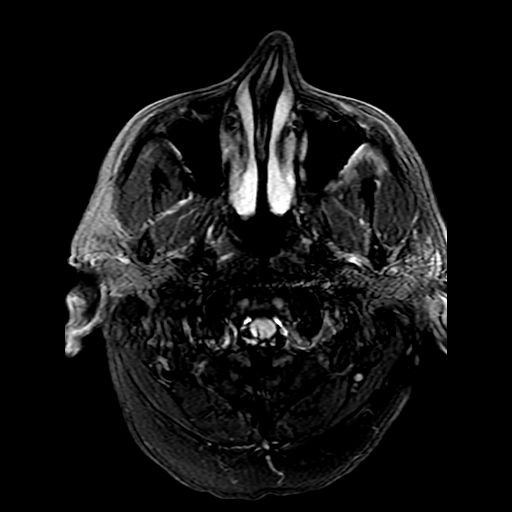
[im 18/35]
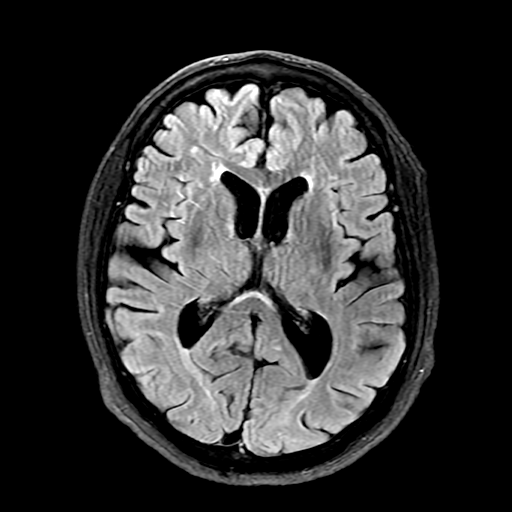
[im 35/35]
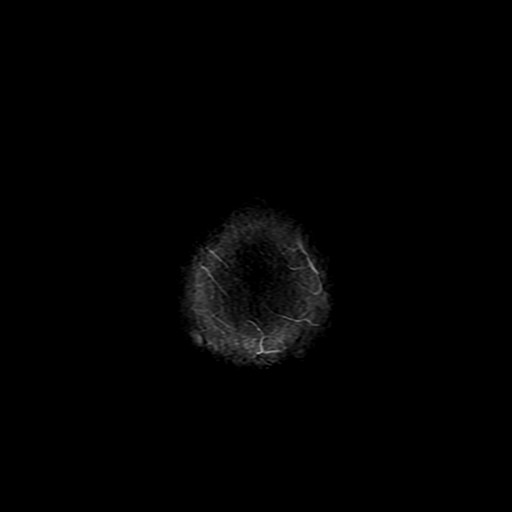

[Series 250: ADC · axial · 3.0mm · 0.94mm/px · z∈[-43,+110]mm · 5 of 52 slices shown (1 of 2)]
[im 1/52]
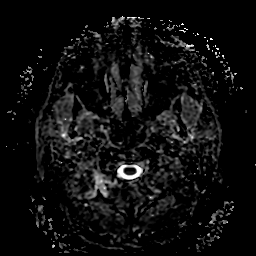
[im 13/52]
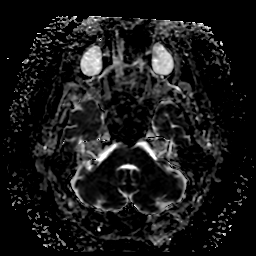
[im 26/52]
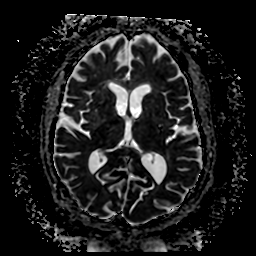
[im 39/52]
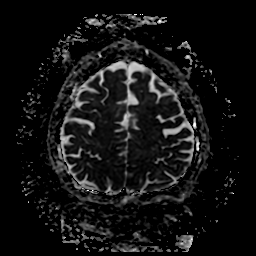
[im 52/52]
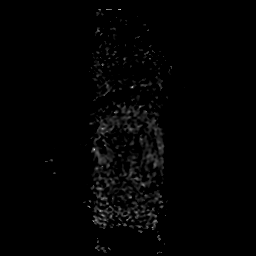

[Series 350: ADC · coronal · 4.0mm · 0.94mm/px · 3 of 36 slices shown (2 of 2)]
[im 1/36]
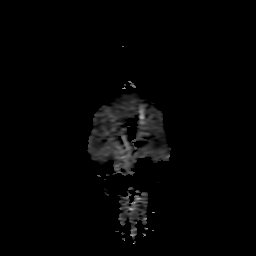
[im 18/36]
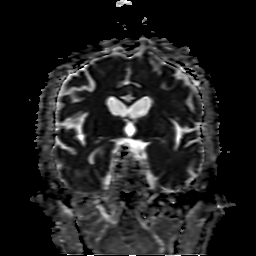
[im 36/36]
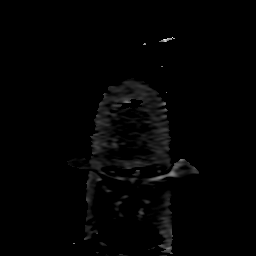

[29 of 48 positions shown; findings below may reference images not displayed]

FINDINGS: Brain:

Mild intermittent motion degradation.

Mild generalized cerebral and cerebellar atrophy.

Mild multifocal T2/FLAIR hyperintensity within the cerebral white
matter, nonspecific but compatible with chronic small vessel
ischemic disease.

Scattered supratentorial and infratentorial chronic
microhemorrhages, nonspecific but likely reflecting sequela of
hypertensive microangiopathy. Superimposed early manifestations of
cerebral amyloid angiopathy can not be excluded.

Additionally, there is subtle SWI signal loss along the occipital
horn of the right lateral ventricle, which may reflect additional
chronic parenchymal microhemorrhage or sequela of remote
intraventricular hemorrhage (for instance as seen on series 8, image
5).

There is no acute infarct.

No evidence of an intracranial mass.

No extra-axial fluid collection.

No midline shift.

Vascular: Maintained flow voids within the proximal large arterial
vessels.

Skull and upper cervical spine: No focal suspicious marrow lesion.

Sinuses/Orbits: Visualized orbits show no acute finding. Mild right
frontal and bilateral ethmoid sinus mucosal thickening.

Other: Right mastoid effusion.
IMPRESSION: Mildly motion degraded exam.

No evidence of acute intracranial abnormality.

Mild chronic small-vessel ischemic changes within the cerebral white
matter.

Scattered supratentorial and infratentorial chronic
microhemorrhages, nonspecific but likely reflecting sequela of
hypertensive microangiopathy. Superimposed early manifestations of
cerebral amyloid angiopathy cannot be excluded.

Additionally, there is subtle SWI signal loss along the occipital
horn of the right lateral ventricle, which may reflect additional
chronic parenchymal microhemorrhage or sequela of remote prior
intraventricular hemorrhage.

Mild generalized cerebral and cerebellar atrophy.

Mild paranasal sinus disease, as described.

Right mastoid effusion.

## 2023-02-08 ENCOUNTER — Ambulatory Visit (INDEPENDENT_AMBULATORY_CARE_PROVIDER_SITE_OTHER): Payer: Medicare Other | Admitting: Family Medicine

## 2023-02-08 ENCOUNTER — Encounter: Payer: Self-pay | Admitting: Family Medicine

## 2023-02-08 VITALS — BP 120/74 | HR 78 | Temp 97.5°F | Ht 68.5 in | Wt 209.4 lb

## 2023-02-08 DIAGNOSIS — H905 Unspecified sensorineural hearing loss: Secondary | ICD-10-CM | POA: Diagnosis not present

## 2023-02-08 DIAGNOSIS — E785 Hyperlipidemia, unspecified: Secondary | ICD-10-CM | POA: Diagnosis not present

## 2023-02-08 DIAGNOSIS — M109 Gout, unspecified: Secondary | ICD-10-CM

## 2023-02-08 DIAGNOSIS — I10 Essential (primary) hypertension: Secondary | ICD-10-CM

## 2023-02-08 NOTE — Progress Notes (Signed)
Provider:  Alain Honey, MD  Careteam: Patient Care Team: [name], MD as PCP - General (Family Medicine)  PLACE OF SERVICE:  [place] Directive information Does Patient Have a Medical Advance Directive?: Yes, Type of Advance Directive: [place];Living will  Allergies  Allergen Reactions   Colchicine     Upset stomach     Chief Complaint  Patient presents with   Medical Management of Chronic Issues    Medical Management of Chronic Issues. Follow up. Wants to discuss medication list with you so he can remove some.    Quality Metric Gaps    To discuss need for Tdap,Zoster and Pneumonia Vaccines or postpone if patient refuses.      HPI: Patient is a 77 y.o. male patient is here today primarily to get a referral to ENT to evaluate his hearing.  He has a history of hearing loss and wears bilateral hearing aids.  Recently saw the ENT in another city who wanted to talk about cochlear implants but he was not interested and would like a another opinion. Also interested in discussing his medicines.  He is on several medicines which she would like to stop.  He does take irbesartan Flonase and allopurinol.  Today we will hold or discontinue pravastatin times also sent in chlorthalidone.  Plan to recheck lipids in 2 months  Review of Systems:  Review of Systems  Constitutional: Negative.   HENT:  Positive for hearing loss.   Eyes: Negative.   Respiratory: Negative.    Cardiovascular: Negative.   Genitourinary: Negative.   Neurological: Negative.   Psychiatric/Behavioral: Negative.    All other systems reviewed and are negative.   Past Medical History:  Diagnosis Date   CKD (chronic kidney disease), stage III (HCC)    HTN (hypertension)    History reviewed. No pertinent surgical history. Social History:   reports that he has never smoked. He has never been exposed to tobacco smoke. He has never used smokeless tobacco. He reports  current alcohol use. He reports that he does not use drugs.  Family History  Problem Relation Age of Onset   Stroke Mother 67   Bronchiolitis Father 22    Medications: Patient's Medications  New Prescriptions   No medications on file  Previous Medications   ALLOPURINOL (ZYLOPRIM) 100 MG TABLET    TAKE 1 TABLET BY MOUTH DAILY   ARTIFICIAL TEARS (LACRILUBE) OINT OPHTHALMIC OINTMENT    Place into both eyes every 3 (three) hours as needed for dry eyes.   AZELASTINE HCL 137 MCG/SPRAY SOLN    Place 2 sprays into both nostrils daily.   AZELASTINE-FLUTICASONE (DYMISTA) 137-50 MCG/ACT SUSP    Place 2 sprays into both nostrils 1 day or 1 dose.   CHLORTHALIDONE (HYGROTON) 25 MG TABLET    Take 12.5 mg by mouth 3 (three) times a week.   FLUTICASONE PROPIONATE NA    Place into the nose daily.   IRBESARTAN (AVAPRO) 75 MG TABLET    Take 75 mg by mouth daily.   LORATADINE (CLARITIN) 10 MG TABLET    Take 10 mg by mouth daily.   MONTELUKAST (SINGULAIR) 10 MG TABLET    Take 1 tablet (10 mg total) by mouth at bedtime.   PRAVASTATIN (PRAVACHOL) 20 MG TABLET    Take 20 mg by mouth daily.   TAMSULOSIN (FLOMAX) 0.4 MG CAPS CAPSULE    TAKE 1 CAPSULE BY MOUTH AT  BEDTIME   VITAMIN D,  ERGOCALCIFEROL, 50 MCG (2000 UT) CAPS    Take by mouth daily.  Modified Medications   No medications on file  Discontinued Medications   No medications on file    Physical Exam:  Vitals:   02/08/23 1129  BP: 120/74  Pulse: 78  Temp: (!) 97.5 F (36.4 C)  SpO2: 96%  Weight: 209 lb 6.4 oz (95 kg)  Height: 5' 8.5" (1.74 m)   Body mass index is 31.38 kg/m. Wt Readings from Last 3 Encounters:  02/08/23 209 lb 6.4 oz (95 kg)  12/27/22 206 lb 12.8 oz (93.8 kg)  11/15/22 212 lb 3.2 oz (96.3 kg)    Physical Exam Vitals and nursing note reviewed.  Constitutional:      Appearance: Normal appearance.  HENT:     Head: Normocephalic.     Right Ear: Tympanic membrane normal.     Left Ear: Tympanic membrane normal.   Cardiovascular:     Rate and Rhythm: Normal rate.  Pulmonary:     Effort: Pulmonary effort is normal.  Neurological:     General: No focal deficit present.     Mental Status: He is alert and oriented to person, place, and time.  Psychiatric:        Mood and Affect: Mood normal.        Behavior: Behavior normal.     Labs reviewed: Basic Metabolic Panel: Recent Labs    01/09/23 0000  K 4.3  CREATININE 1.4*   Liver Function Tests: No results for input(s): "AST", "ALT", "ALKPHOS", "BILITOT", "PROT", "ALBUMIN" in the last 8760 hours. No results for input(s): "LIPASE", "AMYLASE" in the last 8760 hours. No results for input(s): "AMMONIA" in the last 8760 hours. CBC: Recent Labs    01/09/23 0000  HGB 16.5   Lipid Panel: No results for input(s): "CHOL", "HDL", "LDLCALC", "TRIG", "CHOLHDL", "LDLDIRECT" in the last 8760 hours. TSH: No results for input(s): "TSH" in the last 8760 hours. A1C: No results found for: "HGBA1C"   Assessment/Plan  1. Sensory hearing loss Ear exam is normal TMs are shiny with normal light reflex.  Will refer to ENT for further evaluation  2. Benign essential HTN Blood pressure is good at 120/74 on 75 mg of irbesartan.  We are discontinuing chlorthalidone as he was given that for diagnosis of possible Mnire's.  This may also be helping as blood pressure  3. Gouty arthritis No recent flares of gout.  Continue on allopurinol  4. Hyperlipidemia, unspecified hyperlipidemia type Patient wonders if he really needs statin.  He does not know how high lipids were when it was initiated.  Plan to stop statin and repeat Pete lipids in 2 months   Alain Honey, MD [facility] [phone]

## 2023-02-27 DIAGNOSIS — Z23 Encounter for immunization: Secondary | ICD-10-CM | POA: Diagnosis not present

## 2023-03-06 ENCOUNTER — Ambulatory Visit (INDEPENDENT_AMBULATORY_CARE_PROVIDER_SITE_OTHER): Payer: Medicare Other | Admitting: Adult Health

## 2023-03-06 ENCOUNTER — Encounter: Payer: Self-pay | Admitting: Adult Health

## 2023-03-06 VITALS — BP 118/68 | HR 74 | Temp 97.7°F | Resp 17 | Ht 68.5 in | Wt 209.0 lb

## 2023-03-06 DIAGNOSIS — N401 Enlarged prostate with lower urinary tract symptoms: Secondary | ICD-10-CM | POA: Diagnosis not present

## 2023-03-06 DIAGNOSIS — J0110 Acute frontal sinusitis, unspecified: Secondary | ICD-10-CM

## 2023-03-06 DIAGNOSIS — N138 Other obstructive and reflux uropathy: Secondary | ICD-10-CM

## 2023-03-06 DIAGNOSIS — I10 Essential (primary) hypertension: Secondary | ICD-10-CM | POA: Diagnosis not present

## 2023-03-06 MED ORDER — TAMSULOSIN HCL 0.4 MG PO CAPS: 0.4000 mg | ORAL_CAPSULE | Freq: Every day | ORAL | 3 refills | Status: DC

## 2023-03-06 MED ORDER — CEFDINIR 300 MG PO CAPS: 300.0000 mg | ORAL_CAPSULE | Freq: Two times a day (BID) | ORAL | 0 refills | Status: AC

## 2023-03-06 NOTE — Patient Instructions (Signed)

## 2023-03-06 NOTE — Progress Notes (Signed)
[facility]  Provider:  Kenard GowerMonina Medina-Vargas DNP  Code Status:  Full Code  Goals of Care:     03/06/2023   12:57 PM  Advanced Directives  Does Patient Have a Medical Advance Directive? Yes  Type of Estate agentAdvance Directive Healthcare Power of [place];Living will;Out of facility DNR (pink MOST or yellow form)  Does patient want to make changes to medical advance directive? No - Patient declined  Copy of Healthcare Power of Attorney in Chart? No - copy requested     Chief Complaint  Patient presents with   Acute Visit    Patient states he has an allergy appt tomorrow but he feels like he has a sinus infection that can not wait. Some nasal congestion and throat    HPI: Patient is a 77 y.o. male seen today for an acute visit for nasal congestion and throat. He has a PMH of CKD3 and hypertension. He was accompanied today by his wife who is a retired Engineer, civil (consulting)nurse. [facility] [place]. He complained of nasal congestion, post nasal drip for a month now. He has been using using nasal rinse using warm tap water. He felt that his nasal congestion has been getting worse. He has used Flonase but does not seem to help and has stopped it X 2 days. He "does not feel good". He denies chills nor fever. He has a follow up appointment tomorrow at the allergy clinic.  He has an ENT appointment on Apr 20, 2023.  Past Medical History:  Diagnosis Date   CKD (chronic kidney disease), stage III    HTN (hypertension)     History reviewed. No pertinent surgical history.  Allergies  Allergen Reactions   Colchicine     Upset stomach     Outpatient Encounter Medications as of 03/06/2023  Medication Sig   allopurinol (ZYLOPRIM) 100 MG tablet TAKE 1 TABLET BY MOUTH DAILY   artificial tears (LACRILUBE) OINT ophthalmic ointment Place into both eyes every 3 (three) hours as needed for dry eyes.   Azelastine-Fluticasone (DYMISTA) 137-50 MCG/ACT SUSP Place 2 sprays into both nostrils 1 day or 1 dose.    FLUTICASONE PROPIONATE NA Place into the nose daily.   irbesartan (AVAPRO) 75 MG tablet Take 75 mg by mouth daily.   montelukast (SINGULAIR) 10 MG tablet Take 1 tablet (10 mg total) by mouth at bedtime.   tamsulosin (FLOMAX) 0.4 MG CAPS capsule TAKE 1 CAPSULE BY MOUTH AT  BEDTIME   Vitamin D, Ergocalciferol, 50 MCG (2000 UT) CAPS Take by mouth daily.   Azelastine HCl 137 MCG/SPRAY SOLN Place 2 sprays into both nostrils daily. (Patient not taking: Reported on 03/06/2023)   chlorthalidone (HYGROTON) 25 MG tablet Take 12.5 mg by mouth 3 (three) times a week.   loratadine (CLARITIN) 10 MG tablet Take 10 mg by mouth daily. (Patient not taking: Reported on 03/06/2023)   pravastatin (PRAVACHOL) 20 MG tablet Take 20 mg by mouth daily.   No facility-administered encounter medications on file as of 03/06/2023.    Review of Systems:  Review of Systems  Constitutional:  Negative for activity change, appetite change and fever.  HENT:  Positive for congestion and postnasal drip. Negative for sore throat.   Eyes: Negative.   Cardiovascular:  Negative for chest pain and leg swelling.  Gastrointestinal:  Negative for abdominal distention, diarrhea and vomiting.  Genitourinary:  Negative for dysuria, frequency and urgency.  Skin:  Negative for color change.  Neurological:  Negative for dizziness and  headaches.  Psychiatric/Behavioral:  Negative for behavioral problems and sleep disturbance. The patient is not nervous/anxious.     Health Maintenance  Topic Date Due   Zoster Vaccines- Shingrix (1 of 2) 06/05/2023 (Originally [birth year]/07/1996)   Pneumonia Vaccine 2765+ Years old (1 of 1 - PCV) 03/05/2024 (Originally 03/08/2011)   Medicare Annual Wellness (AWV)  04/02/2032 (Originally 01/16/2021)   INFLUENZA VACCINE  07/06/2023   COVID-19 Vaccine  Completed   Hepatitis C Screening  Completed   HPV VACCINES  Aged Out   DTaP/Tdap/Td  Discontinued   COLONOSCOPY (Pts 45-726yrs Insurance coverage will need to be confirmed)   Discontinued    Physical Exam: Vitals:   03/06/23 1258  BP: 118/68  Pulse: 74  Resp: 17  Temp: 97.7 F (36.5 C)  TempSrc: Temporal  SpO2: 96%  Weight: 209 lb (94.8 kg)  Height: 5' 8.5" (1.74 m)   Body mass index is 31.32 kg/m. Physical Exam Constitutional:      General: He is not in acute distress.    Appearance: He is obese.  HENT:     Head: Normocephalic and atraumatic.     Mouth/Throat:     Mouth: Mucous membranes are moist.  Eyes:     Conjunctiva/sclera: Conjunctivae normal.  Cardiovascular:     Rate and Rhythm: Normal rate and regular rhythm.     Pulses: Normal pulses.     Heart sounds: Normal heart sounds.  Pulmonary:     Effort: Pulmonary effort is normal.     Breath sounds: Normal breath sounds.  Abdominal:     General: Bowel sounds are normal.     Palpations: Abdomen is soft.  Musculoskeletal:        General: No swelling. Normal range of motion.     Cervical back: Normal range of motion.  Skin:    General: Skin is warm and dry.  Neurological:     General: No focal deficit present.     Mental Status: He is alert and oriented to person, place, and time.  Psychiatric:        Mood and Affect: Mood normal.        Behavior: Behavior normal.        Thought Content: Thought content normal.        Judgment: Judgment normal.     Labs reviewed: Basic Metabolic Panel: Recent Labs    01/09/23 0000  K 4.3  CREATININE 1.4*   Liver Function Tests: No results for input(s): "AST", "ALT", "ALKPHOS", "BILITOT", "PROT", "ALBUMIN" in the last 8760 hours. No results for input(s): "LIPASE", "AMYLASE" in the last 8760 hours. No results for input(s): "AMMONIA" in the last 8760 hours. CBC: Recent Labs    01/09/23 0000  HGB 16.5   Lipid Panel: No results for input(s): "CHOL", "HDL", "LDLCALC", "TRIG", "CHOLHDL", "LDLDIRECT" in the last 8760 hours. No results found for: "HGBA1C"  Procedures since last visit: No results found.  Assessment/Plan  1. Acute  non-recurrent frontal sinusitis - cefdinir (OMNICEF) 300 MG capsule; Take 1 capsule (300 mg total) by mouth 2 (two) times daily for 7 days. (Patient not taking: Reported on 03/07/2023)  Dispense: 14 capsule; Refill: 0  2. Benign essential HTN -  BP 118/68, stable -   continue Chlorthalidone and Avapro  3. BPH with obstruction/lower urinary tract symptoms - tamsulosin (FLOMAX) 0.4 MG CAPS capsule; Take 1 capsule (0.4 mg total) by mouth at bedtime. (Patient not taking: Reported on 03/07/2023)  Dispense: 90 capsule; Refill: 3    Labs/tests ordered:  None  Next appt:  Visit date not found

## 2023-03-07 ENCOUNTER — Ambulatory Visit (INDEPENDENT_AMBULATORY_CARE_PROVIDER_SITE_OTHER): Payer: Medicare Other | Admitting: Allergy & Immunology

## 2023-03-07 ENCOUNTER — Other Ambulatory Visit: Payer: Self-pay

## 2023-03-07 VITALS — BP 102/52 | HR 82 | Temp 97.9°F | Resp 12 | Wt 207.9 lb

## 2023-03-07 DIAGNOSIS — J3089 Other allergic rhinitis: Secondary | ICD-10-CM

## 2023-03-07 DIAGNOSIS — J329 Chronic sinusitis, unspecified: Secondary | ICD-10-CM

## 2023-03-07 DIAGNOSIS — J302 Other seasonal allergic rhinitis: Secondary | ICD-10-CM

## 2023-03-07 DIAGNOSIS — R0981 Nasal congestion: Secondary | ICD-10-CM

## 2023-03-07 MED ORDER — AZELASTINE HCL 137 MCG/SPRAY NA SOLN: 2.0000 | Freq: Every day | NASAL | 11 refills | Status: DC

## 2023-03-07 NOTE — Progress Notes (Signed)
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 Pollyann Kennedy in one month  Plan/Recommendations:   1. Seasonal and perennial allergic rhinitis - Previous testing showed: grasses, ragweed, weeds, trees, and indoor molds - Continue to hold the allergy medications until your cefdinir is completed.  - STOP taking: Claritin (loratadine) and Singulair (montelukast) 10mg  daily - Continue with: Singulair (montelukast) 10mg  daily, Flonase (fluticasone) one spray per nostril daily (AIM FOR EAR ON EACH SIDE), and Astelin (azelastine) 2 sprays per nostril 1-2 times daily as needed - Start taking: Zyrtec (cetirizine) 10mg  tablet once daily and Mucinex 1200mg  twice daily. - Call us in one week and let us know how you are feeling. - We are going to order a sinus CT to see if there is something anatomic going on.   2. Return in about 3 months (around 06/06/2023).    Subjective:   Andre [name] is a 77 y.o. 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  Andre [name] is a 77 y.o. male presenting for a follow up visit.  He was last seen January 2024.  At that time, testing was  positive to grasses, ragweed, weeds, trees, and indoor molds.  We continue with Claritin and Flonase as well as Astelin and started Singulair.  Since last visit, he has not done well. He has been using the nose sprays consistently. He is also using the nose sprays daily. He ended up stopping because he felt that it was interfering his air movement. He just did start Afrin in the last 24 hours.   He had seen ENT in the past. This was in [place] and he has an appointment to see Dr. Pollyann Kennedy on May 2nd. He saw him three years ago, but this referral is something to do with a cochlear implant.   He recently started an antibiotic - cefdinir 300mg  twice daily for 7 days. He has not noticed an improvement with this yet. He has fluticasone and azelastine   Otherwise, there have been no changes to his past medical history, surgical history, family history, or social history.    Review of Systems  Constitutional: Negative.  Negative for chills, fever, malaise/fatigue and weight loss.  HENT:  Positive for congestion. Negative for ear discharge, ear pain and sinus pain.   Eyes:  Negative for pain, discharge and redness.  Respiratory:  Negative for cough, sputum production, shortness of breath and wheezing.   Cardiovascular: Negative.  Negative for chest pain and palpitations.  Gastrointestinal:  Negative for abdominal pain, constipation, diarrhea, heartburn, nausea and vomiting.  Skin: Negative.  Negative for itching and rash.  Neurological:  Negative for dizziness and headaches.  Endo/Heme/Allergies:  Positive for environmental allergies. Does not bruise/bleed easily.       Objective:   Blood pressure (!) 102/52, pulse 82, temperature 97.9 F (36.6 C), temperature source Oral, resp. rate 12, weight 207 lb 14.4 oz (94.3 kg), SpO2 96 %. Body mass index is 31.15 kg/m.    Physical Exam Vitals reviewed.  Constitutional:      Appearance: He is well-developed.  HENT:     Head: Normocephalic  and atraumatic.     Right Ear: Tympanic membrane, ear canal and external ear normal. No drainage, swelling or tenderness. Tympanic membrane is not injected, scarred, erythematous, retracted or bulging.     Left Ear: Tympanic membrane, ear canal and external ear normal. No drainage, swelling or tenderness. Tympanic membrane is not injected, scarred, erythematous, retracted or bulging.     Ears:     Comments: Hearing aids in place.  Hard of hearing.    Nose: No nasal deformity, septal deviation, mucosal edema or rhinorrhea.     Right Turbinates: Enlarged, swollen and pale.     Left Turbinates: Enlarged, swollen and pale.     Right Sinus: No maxillary sinus tenderness or frontal sinus tenderness.     Left Sinus: No maxillary sinus tenderness or frontal sinus tenderness.     Mouth/Throat:     Mouth: Mucous membranes are not pale and not dry.     Pharynx: Uvula midline.  Eyes:     General:        Right eye: No discharge.        Left eye: No discharge.     Conjunctiva/sclera: Conjunctivae normal.     Right eye: Right conjunctiva is not injected. No chemosis.    Left eye: Left conjunctiva is not injected. No chemosis.    Pupils: Pupils are equal, round, and reactive to light.  Cardiovascular:     Rate and Rhythm: Normal rate and regular rhythm.     Heart sounds: Normal heart sounds.  Pulmonary:     Effort: Pulmonary effort is normal. No tachypnea, accessory muscle usage or respiratory distress.     Breath sounds: Normal breath sounds. No wheezing, rhonchi or rales.     Comments: Moving air well in all lung fields. No increased work of breathing.  Chest:     Chest wall: No tenderness.  Abdominal:     Tenderness: There is no abdominal tenderness. There is no guarding or rebound.  Lymphadenopathy:     Head:     Right side of head: No submandibular, tonsillar or occipital adenopathy.     Left side of head: No submandibular, tonsillar or occipital adenopathy.     Cervical: No cervical  adenopathy.  Skin:    Coloration: Skin is not pale.     Findings: No abrasion, erythema, petechiae or rash. Rash is not papular, urticarial or vesicular.  Neurological:     Mental Status: He is alert.  Psychiatric:        Behavior: Behavior is cooperative.      Diagnostic studies: none      Malachi Bonds, MD  [facility]

## 2023-03-07 NOTE — Patient Instructions (Addendum)
1. Seasonal and perennial allergic rhinitis - Previous testing showed: grasses, ragweed, weeds, trees, and indoor molds - Continue to hold the allergy medications until your cefdinir is completed.  - STOP taking: Claritin (loratadine) and Singulair (montelukast) 10mg  daily - Continue with: Singulair (montelukast) 10mg  daily, Flonase (fluticasone) one spray per nostril daily (AIM FOR EAR ON EACH SIDE), and Astelin (azelastine) 2 sprays per nostril 1-2 times daily as needed - Start taking: Zyrtec (cetirizine) 10mg  tablet once daily and Mucinex 1200mg  twice daily. - Call us in one week and let us know how you are feeling. - We are going to order a sinus CT to see if there is something anatomic going on.   2. Return in about 3 months (around 06/06/2023).    Please inform us of any Emergency Department visits, hospitalizations, or changes in symptoms. Call us before going to the ED for breathing or allergy symptoms since we might be able to fit you in for a sick visit. Feel free to contact us anytime with any questions, problems, or concerns.  It was a pleasure to see you guys!  Websites that have reliable patient information: 1. American Academy of Asthma, Allergy, and Immunology: www.aaaai.org 2. Food Allergy Research and Education (FARE): foodallergy.org 3. Mothers of Asthmatics: http://www.asthmacommunitynetwork.org 4. American College of Allergy, Asthma, and Immunology: www.acaai.org   COVID-19 Vaccine Information can be found at: ShippingScam.co.uk For questions related to vaccine distribution or appointments, please email vaccine@[email or web address] [email or web address] or call [phone].   We realize that you might be concerned about having an allergic reaction to the COVID19 vaccines. To help with that concern, WE ARE OFFERING THE COVID19 VACCINES IN OUR OFFICE! Ask the front desk for dates!     "Like" [place] on Facebook and Instagram for our latest  updates!      A healthy democracy works best when [employer] participate! Make sure you are registered to vote! If you have moved or changed any of your contact information, you will need to get this updated before voting!  In some cases, you MAY be able to register to vote online: [email or web address]

## 2023-03-08 ENCOUNTER — Encounter: Payer: Self-pay | Admitting: Allergy & Immunology

## 2023-03-08 MED ORDER — CETIRIZINE HCL 10 MG PO TABS: 10.0000 mg | ORAL_TABLET | Freq: Every day | ORAL | 3 refills | Status: AC

## 2023-03-08 MED ORDER — FLUTICASONE PROPIONATE 50 MCG/ACT NA SUSP: 2.0000 | Freq: Every day | NASAL | 2 refills | Status: AC

## 2023-03-09 ENCOUNTER — Telehealth: Payer: Self-pay

## 2023-03-09 NOTE — Telephone Encounter (Signed)
Called [employer] and pre cert is not needed at this time for the maxofacial without contrast ct ref [id number]

## 2023-04-04 ENCOUNTER — Ambulatory Visit
Admission: RE | Admit: 2023-04-04 | Discharge: 2023-04-04 | Disposition: A | Discharge status: Home / Self Care | Payer: Medicare Other | Source: Ambulatory Visit | Attending: Allergy & Immunology | Admitting: Allergy & Immunology

## 2023-04-04 DIAGNOSIS — J329 Chronic sinusitis, unspecified: Secondary | ICD-10-CM

## 2023-04-04 DIAGNOSIS — R0981 Nasal congestion: Secondary | ICD-10-CM

## 2023-04-09 ENCOUNTER — Encounter: Payer: Self-pay | Admitting: Family Medicine

## 2023-04-09 ENCOUNTER — Other Ambulatory Visit: Payer: Self-pay | Admitting: Family Medicine

## 2023-04-10 ENCOUNTER — Other Ambulatory Visit: Payer: Self-pay

## 2023-04-10 MED ORDER — IRBESARTAN 75 MG PO TABS: 75.0000 mg | ORAL_TABLET | Freq: Every day | ORAL | 1 refills | Status: DC

## 2023-04-20 DIAGNOSIS — H9113 Presbycusis, bilateral: Secondary | ICD-10-CM | POA: Diagnosis not present

## 2023-04-20 DIAGNOSIS — H903 Sensorineural hearing loss, bilateral: Secondary | ICD-10-CM | POA: Diagnosis not present

## 2023-04-20 DIAGNOSIS — J309 Allergic rhinitis, unspecified: Secondary | ICD-10-CM | POA: Diagnosis not present

## 2023-06-26 ENCOUNTER — Telehealth: Payer: Medicare Other | Admitting: *Deleted

## 2023-06-26 NOTE — Telephone Encounter (Signed)
Patient called wanting to know if he needed Labwork to see if he needed to continue his Cholesterol medication.   Stated that he would like to have lab orders placed to have labs drawn here at the office [facility].   Please Advise.

## 2023-06-27 ENCOUNTER — Other Ambulatory Visit: Payer: Self-pay | Admitting: Family Medicine

## 2023-06-27 DIAGNOSIS — E785 Hyperlipidemia, unspecified: Secondary | ICD-10-CM

## 2023-06-27 NOTE — Telephone Encounter (Signed)
Had previous hold on statin; plan to repeat  lipid to see if really needed

## 2023-06-28 ENCOUNTER — Ambulatory Visit: Payer: Medicare Other | Admitting: Family Medicine

## 2023-07-05 ENCOUNTER — Other Ambulatory Visit: Payer: Self-pay

## 2023-07-05 ENCOUNTER — Encounter: Payer: Self-pay | Admitting: Family Medicine

## 2023-07-05 ENCOUNTER — Non-Acute Institutional Stay: Payer: Medicare Other | Admitting: Family Medicine

## 2023-07-05 VITALS — BP 128/76 | HR 71 | Temp 97.3°F | Resp 18 | Ht 68.5 in | Wt 207.0 lb

## 2023-07-05 DIAGNOSIS — E785 Hyperlipidemia, unspecified: Secondary | ICD-10-CM

## 2023-07-05 DIAGNOSIS — M109 Gout, unspecified: Secondary | ICD-10-CM | POA: Diagnosis not present

## 2023-07-05 DIAGNOSIS — I10 Essential (primary) hypertension: Secondary | ICD-10-CM

## 2023-07-05 NOTE — Progress Notes (Signed)
Provider:  Jacalyn Lefevre, MD  Careteam: Patient Care Team: Frederica Kuster, MD as PCP - General (Family Medicine)  PLACE OF SERVICE:  [facility]  Advanced Directive information    Allergies  Allergen Reactions   Colchicine     Upset stomach     Chief Complaint  Patient presents with   Acute Visit    Patient needs lab work done, just got over covid, and is having hearing loss     HPI: Patient is a 77 y.o. male patient had some myalgias and mild cough with COVID but symptoms were very mild.  Has isolated now greater than 5 days and should be good to go he wonders if he needs another test I told him we do not think so but if he wanted to be sure about continued isolation to go ahead He is scheduled for repeat lipid panel in the morning since we stopped statin  Review of Systems:  Review of Systems  All other systems reviewed and are negative.   Past Medical History:  Diagnosis Date   CKD (chronic kidney disease), stage III (HCC)    HTN (hypertension)    History reviewed. No pertinent surgical history. Social History:   reports that he has never smoked. He has never been exposed to tobacco smoke. He has never used smokeless tobacco. He reports current alcohol use. He reports that he does not use drugs.  Family History  Problem Relation Age of Onset   Stroke Mother 21   Bronchiolitis Father 35    Medications: Patient's Medications  New Prescriptions   No medications on file  Previous Medications   ALLOPURINOL (ZYLOPRIM) 100 MG TABLET    TAKE 1 TABLET BY MOUTH DAILY   ARTIFICIAL TEARS (LACRILUBE) OINT OPHTHALMIC OINTMENT    Place into both eyes every 3 (three) hours as needed for dry eyes.   AZELASTINE HCL 137 MCG/SPRAY SOLN    Place 2 sprays into both nostrils daily.   AZELASTINE-FLUTICASONE (DYMISTA) 137-50 MCG/ACT SUSP    Place 2 sprays into both nostrils 1 day or 1 dose.   CETIRIZINE (ZYRTEC) 10 MG TABLET    Take 1 tablet (10 mg total) by mouth daily.    FLUTICASONE (FLONASE) 50 MCG/ACT NASAL SPRAY    Place 2 sprays into both nostrils daily.   FLUTICASONE PROPIONATE NA    Place into the nose daily.   IRBESARTAN (AVAPRO) 75 MG TABLET    Take 1 tablet (75 mg total) by mouth daily.   TAMSULOSIN (FLOMAX) 0.4 MG CAPS CAPSULE    Take 1 capsule (0.4 mg total) by mouth at bedtime.   VITAMIN D, ERGOCALCIFEROL, 50 MCG (2000 UT) CAPS    Take by mouth daily.  Modified Medications   No medications on file  Discontinued Medications   LORATADINE (CLARITIN) 10 MG TABLET    Take 10 mg by mouth daily.    Physical Exam:  Vitals:   07/05/23 1052  BP: 128/76  Pulse: 71  Resp: 18  Temp: (!) 97.3 F (36.3 C)  SpO2: 95%  Weight: 207 lb (93.9 kg)  Height: 5' 8.5" (1.74 m)   Body mass index is 31.02 kg/m. Wt Readings from Last 3 Encounters:  07/05/23 207 lb (93.9 kg)  03/07/23 207 lb 14.4 oz (94.3 kg)  03/06/23 209 lb (94.8 kg)    Physical Exam Vitals and nursing note reviewed.  Constitutional:      Appearance: Normal appearance.  Cardiovascular:     Rate and  Rhythm: Normal rate and regular rhythm.     Pulses: Normal pulses.     Heart sounds: Normal heart sounds.  Neurological:     Mental Status: He is alert.     Labs reviewed: Basic Metabolic Panel: Recent Labs    01/09/23 0000  K 4.3  CREATININE 1.4*   Liver Function Tests: No results for input(s): "AST", "ALT", "ALKPHOS", "BILITOT", "PROT", "ALBUMIN" in the last 8760 hours. No results for input(s): "LIPASE", "AMYLASE" in the last 8760 hours. No results for input(s): "AMMONIA" in the last 8760 hours. CBC: Recent Labs    01/09/23 0000  HGB 16.5   Lipid Panel: No results for input(s): "CHOL", "HDL", "LDLCALC", "TRIG", "CHOLHDL", "LDLDIRECT" in the last 8760 hours. TSH: No results for input(s): "TSH" in the last 8760 hours. A1C: No results found for: "HGBA1C"   Assessment/Plan  1. Benign essential HTN B he is on irbesartan 75 mg lood pressure today 128/76  2. Gouty  arthritis Has been taking allopurinol without any recent flares of gout.  Will continue same   Jacalyn Lefevre, MD [facility] [phone]

## 2023-07-06 DIAGNOSIS — E785 Hyperlipidemia, unspecified: Secondary | ICD-10-CM | POA: Diagnosis not present

## 2023-08-20 ENCOUNTER — Other Ambulatory Visit: Payer: Self-pay | Admitting: Family Medicine

## 2023-09-18 DIAGNOSIS — Z23 Encounter for immunization: Secondary | ICD-10-CM | POA: Diagnosis not present

## 2023-10-10 ENCOUNTER — Ambulatory Visit (INDEPENDENT_AMBULATORY_CARE_PROVIDER_SITE_OTHER): Payer: Medicare Other | Admitting: Sports Medicine

## 2023-10-10 VITALS — BP 126/82 | HR 75 | Temp 97.2°F | Resp 16 | Ht 68.5 in | Wt 211.2 lb

## 2023-10-10 DIAGNOSIS — R062 Wheezing: Secondary | ICD-10-CM | POA: Diagnosis not present

## 2023-10-10 DIAGNOSIS — R059 Cough, unspecified: Secondary | ICD-10-CM | POA: Diagnosis not present

## 2023-10-10 DIAGNOSIS — R0981 Nasal congestion: Secondary | ICD-10-CM | POA: Diagnosis not present

## 2023-10-10 LAB — POC COVID19 BINAXNOW: SARS Coronavirus 2 Ag: NEGATIVE

## 2023-10-10 MED ORDER — DOXYCYCLINE HYCLATE 100 MG PO TABS
100.0000 mg | ORAL_TABLET | Freq: Two times a day (BID) | ORAL | 0 refills | Status: DC
Start: 2023-10-10 — End: 2023-11-10

## 2023-10-10 MED ORDER — PREDNISONE 20 MG PO TABS
40.0000 mg | ORAL_TABLET | Freq: Every day | ORAL | 0 refills | Status: DC
Start: 2023-10-10 — End: 2023-11-10

## 2023-10-10 MED ORDER — ALBUTEROL SULFATE HFA 108 (90 BASE) MCG/ACT IN AERS
2.0000 | INHALATION_SPRAY | Freq: Four times a day (QID) | RESPIRATORY_TRACT | 2 refills | Status: DC | PRN
Start: 2023-10-10 — End: 2024-03-08

## 2023-10-10 MED ORDER — MONTELUKAST SODIUM 10 MG PO TABS
10.0000 mg | ORAL_TABLET | Freq: Every day | ORAL | 3 refills | Status: DC
Start: 2023-10-10 — End: 2024-01-31

## 2023-10-10 NOTE — Progress Notes (Signed)
Careteam: Patient Care Team: Venita Sheffield, MD as PCP - General (Internal Medicine)  PLACE OF SERVICE:  [facility]  Advanced Directive information Does Patient Have a Medical Advance Directive?: Yes, Type of Advance Directive: Healthcare Power of [place];Living will;Out of facility DNR (pink MOST or yellow form), Does patient want to make changes to medical advance directive?: No - Patient declined  Allergies  Allergen Reactions   Colchicine     Upset stomach     Chief Complaint  Patient presents with   Acute Visit    Patient complains of congestion getting worse, and coughing.     HPI: Patient is a 77 y.o. male presented to clinic for congestion Pt c/o nasal congestion, watering eyes since few weeks  Denies fevers Denies sinus tenderness Has good appetite  Denies feeling dizzy or lightheaded, double vision Denies ear pain, sore throat  Has intermittent productive cough with yellowish phlegm  Denies sob , diarrhea, abdominal pain, nausea, vomiting  Using saline rinse   Review of Systems:  Review of Systems  Constitutional:  Negative for chills and fever.  HENT:  Positive for congestion. Negative for sore throat.   Eyes:  Negative for double vision.  Respiratory:  Positive for cough, sputum production and wheezing. Negative for shortness of breath.   Cardiovascular:  Negative for chest pain, palpitations and leg swelling.  Gastrointestinal:  Negative for abdominal pain, heartburn and nausea.  Genitourinary:  Negative for dysuria, frequency and hematuria.  Musculoskeletal:  Negative for falls and myalgias.  Neurological:  Negative for dizziness, sensory change and focal weakness.    Past Medical History:  Diagnosis Date   CKD (chronic kidney disease), stage III (HCC)    HTN (hypertension)    History reviewed. No pertinent surgical history. Social History:   reports that he has never smoked. He has never been exposed to tobacco smoke. He has never used  smokeless tobacco. He reports that he does not currently use alcohol. He reports that he does not use drugs.  Family History  Problem Relation Age of Onset   Stroke Mother 65   Bronchiolitis Father 45    Medications: Patient's Medications  New Prescriptions   No medications on file  Previous Medications   ALLOPURINOL (ZYLOPRIM) 100 MG TABLET    TAKE 1 TABLET BY MOUTH DAILY   ARTIFICIAL TEARS (LACRILUBE) OINT OPHTHALMIC OINTMENT    Place into both eyes every 3 (three) hours as needed for dry eyes.   AZELASTINE HCL 137 MCG/SPRAY SOLN    Place 2 sprays into both nostrils daily.   CETIRIZINE (ZYRTEC) 10 MG TABLET    Take 1 tablet (10 mg total) by mouth daily.   FLUTICASONE (FLONASE) 50 MCG/ACT NASAL SPRAY    Place 2 sprays into both nostrils daily.   IRBESARTAN (AVAPRO) 75 MG TABLET    Take 1 tablet (75 mg total) by mouth daily. Need appointment before anymore future refills.   TAMSULOSIN (FLOMAX) 0.4 MG CAPS CAPSULE    Take 1 capsule (0.4 mg total) by mouth at bedtime.   VITAMIN D, ERGOCALCIFEROL, 50 MCG (2000 UT) CAPS    Take by mouth daily.  Modified Medications   No medications on file  Discontinued Medications   AZELASTINE-FLUTICASONE (DYMISTA) 137-50 MCG/ACT SUSP    Place 2 sprays into both nostrils 1 day or 1 dose.   FLUTICASONE PROPIONATE NA    Place into the nose daily.    Physical Exam:  Vitals:   10/10/23 1034  BP: 126/82  Pulse: 75  Resp: 16  Temp: (!) 97.2 F (36.2 C)  SpO2: 95%  Weight: 211 lb 3.2 oz (95.8 kg)  Height: 5' 8.5" (1.74 m)   Body mass index is 31.65 kg/m. Wt Readings from Last 3 Encounters:  10/10/23 211 lb 3.2 oz (95.8 kg)  07/05/23 207 lb (93.9 kg)  03/07/23 207 lb 14.4 oz (94.3 kg)    Physical Exam Constitutional:      Appearance: Normal appearance.  HENT:     Head: Normocephalic and atraumatic.     Right Ear: There is no impacted cerumen.     Left Ear: There is no impacted cerumen.     Mouth/Throat:     Pharynx: Posterior  oropharyngeal erythema present. No oropharyngeal exudate.  Cardiovascular:     Rate and Rhythm: Normal rate and regular rhythm.     Pulses: Normal pulses.     Heart sounds: Normal heart sounds.  Pulmonary:     Effort: No respiratory distress.     Breath sounds: No stridor. Wheezing present. No rales.  Abdominal:     General: Bowel sounds are normal. There is no distension.     Palpations: Abdomen is soft.     Tenderness: There is no abdominal tenderness. There is no right CVA tenderness or guarding.  Musculoskeletal:        General: No swelling.  Neurological:     Mental Status: He is alert. Mental status is at baseline.     Sensory: No sensory deficit.     Motor: No weakness.     Labs reviewed: Basic Metabolic Panel: Recent Labs    01/09/23 0000  K 4.3  CREATININE 1.4*   Liver Function Tests: No results for input(s): "AST", "ALT", "ALKPHOS", "BILITOT", "PROT", "ALBUMIN" in the last 8760 hours. No results for input(s): "LIPASE", "AMYLASE" in the last 8760 hours. No results for input(s): "AMMONIA" in the last 8760 hours. CBC: Recent Labs    01/09/23 0000  HGB 16.5   Lipid Panel: Recent Labs    07/06/23 0700  CHOL 158  HDL 42  LDLCALC 91  TRIG 155*  CHOLHDL 3.8   TSH: No results for input(s): "TSH" in the last 8760 hours. A1C: No results found for: "HGBA1C"   Assessment/Plan  1. Cough, unspecified type Covid neg Lungs - exp wheezing - doxycycline (VIBRA-TABS) 100 MG tablet; Take 1 tablet (100 mg total) by mouth 2 (two) times daily.  Dispense: 10 tablet; Refill: 0  2. Nasal congestion Instructed him to take flonase, zyrtec  Will start singulair  - montelukast (SINGULAIR) 10 MG tablet; Take 1 tablet (10 mg total) by mouth at bedtime.  Dispense: 30 tablet; Refill: 3    Wheezing No prior h/o asthma or copd Never smoked Will start albuterol q6 prn  Will send prednisone to pharmacy Will follow up in 4 weeks  - predniSONE (DELTASONE) 20 MG tablet; Take 2  tablets (40 mg total) by mouth daily with breakfast.  Dispense: 10 tablet; Refill: 0 - albuterol (VENTOLIN HFA) 108 (90 Base) MCG/ACT inhaler; Inhale 2 puffs into the lungs every 6 (six) hours as needed for wheezing or shortness of breath.  Dispense: 18 g; Refill: 2   No follow-ups on file.: 4 weeks

## 2023-10-10 NOTE — Patient Instructions (Signed)
Take singulair daily for allergies Cont with flonase, azelastine , zyrtec Use albuterol inhaler every 6 to 8 hrs as needed for wheezing  Take prednisone 40 mg daily for 5 days

## 2023-10-19 ENCOUNTER — Other Ambulatory Visit: Payer: Self-pay | Admitting: Sports Medicine

## 2023-11-10 ENCOUNTER — Encounter: Payer: Self-pay | Admitting: Sports Medicine

## 2023-11-10 ENCOUNTER — Non-Acute Institutional Stay: Payer: Medicare Other | Admitting: Sports Medicine

## 2023-11-10 VITALS — BP 124/78 | HR 89 | Temp 97.0°F | Resp 18 | Ht 68.5 in | Wt 215.2 lb

## 2023-11-10 DIAGNOSIS — H1013 Acute atopic conjunctivitis, bilateral: Secondary | ICD-10-CM

## 2023-11-10 DIAGNOSIS — N138 Other obstructive and reflux uropathy: Secondary | ICD-10-CM | POA: Diagnosis not present

## 2023-11-10 DIAGNOSIS — N401 Enlarged prostate with lower urinary tract symptoms: Secondary | ICD-10-CM

## 2023-11-10 DIAGNOSIS — R062 Wheezing: Secondary | ICD-10-CM | POA: Diagnosis not present

## 2023-11-10 DIAGNOSIS — J0101 Acute recurrent maxillary sinusitis: Secondary | ICD-10-CM

## 2023-11-10 DIAGNOSIS — I10 Essential (primary) hypertension: Secondary | ICD-10-CM

## 2023-11-10 MED ORDER — KETOTIFEN FUMARATE 0.035 % OP SOLN: 1.0000 [drp] | Freq: Two times a day (BID) | OPHTHALMIC | 3 refills | Status: DC

## 2023-11-10 MED ORDER — AMOXICILLIN-POT CLAVULANATE 500-125 MG PO TABS
1.0000 | ORAL_TABLET | Freq: Two times a day (BID) | ORAL | 0 refills | Status: DC
Start: 2023-11-10 — End: 2024-03-08

## 2023-11-10 NOTE — Progress Notes (Addendum)
Careteam: Patient Care Team: Venita Sheffield, MD as PCP - General (Internal Medicine)  PLACE OF SERVICE:  [facility]  Advanced Directive information    Allergies  Allergen Reactions   Colchicine     Upset stomach     Chief Complaint  Patient presents with   Medical Management of Chronic Issues    1 month follow-up   Immunizations    Shingrix     HPI: Patient is a 77 y.o. 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  HENT:  Positive for congestion and sinus pain. Negative for ear discharge, ear pain and sore throat.   Eyes:  Negative for double vision.  Respiratory:  Negative for cough, sputum production and shortness of breath.   Cardiovascular:  Negative for chest pain, palpitations and leg swelling.  Gastrointestinal:  Negative for abdominal pain, heartburn and nausea.  Genitourinary:  Negative for dysuria, frequency and hematuria.  Musculoskeletal:  Negative for falls and myalgias.  Neurological:  Negative for dizziness, sensory change and focal weakness.   Negative unless indicated in HPI.   Past Medical History:  Diagnosis Date   CKD (chronic kidney disease), stage III (HCC)    HTN (hypertension)    History reviewed. No pertinent surgical history. Social History:   reports that he has never smoked. He has never been exposed to tobacco  smoke. He has never used smokeless tobacco. He reports that he does not currently use alcohol. He reports that he does not use drugs.  Family History  Problem Relation Age of Onset   Stroke Mother 81   Bronchiolitis Father 59    Medications: Patient's Medications  New Prescriptions   AMOXICILLIN-CLAVULANATE (AUGMENTIN) 500-125 MG TABLET    Take 1 tablet by mouth in the morning and at bedtime.  Previous Medications   ALBUTEROL (VENTOLIN HFA) 108 (90 BASE) MCG/ACT INHALER    Inhale 2 puffs into the lungs every 6 (six) hours as needed for wheezing or shortness of breath.   ALLOPURINOL (ZYLOPRIM) 100 MG TABLET    TAKE 1 TABLET BY MOUTH DAILY   ARTIFICIAL TEARS (LACRILUBE) OINT OPHTHALMIC OINTMENT    Place into both eyes every 3 (three) hours as needed for dry eyes.   AZELASTINE HCL 137 MCG/SPRAY SOLN    Place 2 sprays into both nostrils daily.   CETIRIZINE (ZYRTEC) 10 MG TABLET    Take 1 tablet (10 mg total) by mouth daily.   FLUTICASONE (FLONASE) 50 MCG/ACT NASAL SPRAY    Place 2 sprays into both nostrils daily.   IRBESARTAN (AVAPRO) 75 MG TABLET    Take 1 tablet (75 mg total) by mouth daily.   MONTELUKAST (SINGULAIR) 10 MG TABLET    Take 1 tablet (10 mg total) by mouth at bedtime.   TAMSULOSIN (FLOMAX) 0.4 MG  CAPS CAPSULE    Take 1 capsule (0.4 mg total) by mouth at bedtime.   VITAMIN D, ERGOCALCIFEROL, 50 MCG (2000 UT) CAPS    Take by mouth daily.  Modified Medications   No medications on file  Discontinued Medications   DOXYCYCLINE (VIBRA-TABS) 100 MG TABLET    Take 1 tablet (100 mg total) by mouth 2 (two) times daily.   PREDNISONE (DELTASONE) 20 MG TABLET    Take 2 tablets (40 mg total) by mouth daily with breakfast.    Physical Exam: Vitals:   11/10/23 0822  BP: 124/78  Pulse: 89  Resp: 18  Temp: (!) 97 F (36.1 C)  SpO2: 97%  Weight: 215 lb 3.2 oz (97.6 kg)  Height: 5' 8.5" (1.74 m)   Body mass index is 32.25 kg/m. BP Readings from Last 3 Encounters:  11/10/23 124/78   10/10/23 126/82  07/05/23 128/76   Wt Readings from Last 3 Encounters:  11/10/23 215 lb 3.2 oz (97.6 kg)  10/10/23 211 lb 3.2 oz (95.8 kg)  07/05/23 207 lb (93.9 kg)    Physical Exam Constitutional:      Appearance: Normal appearance.  HENT:     Head: Normocephalic and atraumatic.     Ears:     Comments: Unable to examine ear, no  otoscope  available at the facility     Nose:     Comments: Mild maxillary sinus tenderness    Mouth/Throat:     Pharynx: Posterior oropharyngeal erythema present.  Cardiovascular:     Rate and Rhythm: Normal rate and regular rhythm.     Pulses: Normal pulses.     Heart sounds: Normal heart sounds.  Pulmonary:     Effort: No respiratory distress.     Breath sounds: No stridor. No wheezing or rales.  Abdominal:     General: Bowel sounds are normal. There is no distension.     Palpations: Abdomen is soft.     Tenderness: There is no abdominal tenderness. There is no right CVA tenderness or guarding.  Musculoskeletal:        General: No swelling.  Neurological:     Mental Status: He is alert. Mental status is at baseline.     Sensory: No sensory deficit.     Motor: No weakness.     Labs reviewed: Basic Metabolic Panel: Recent Labs    01/09/23 0000  K 4.3  CREATININE 1.4*   Liver Function Tests: No results for input(s): "AST", "ALT", "ALKPHOS", "BILITOT", "PROT", "ALBUMIN" in the last 8760 hours. No results for input(s): "LIPASE", "AMYLASE" in the last 8760 hours. No results for input(s): "AMMONIA" in the last 8760 hours. CBC: Recent Labs    01/09/23 0000  HGB 16.5   Lipid Panel: Recent Labs    07/06/23 0700  CHOL 158  HDL 42  LDLCALC 91  TRIG 155*  CHOLHDL 3.8   TSH: No results for input(s): "TSH" in the last 8760 hours. A1C: No results found for: "HGBA1C"   Assessment/Plan 1. Benign essential HTN At goal  Cont with the same - Basic Metabolic Panel with eGFR    BPH with obstruction/lower urinary tract  symptoms Cont with flomax   Wheezing No wheezing on aus    Acute recurrent maxillary sinusitis Maxillary sinus tenderness  Will start augmentin  Instructed to take flonase, claritin - amoxicillin-clavulanate (AUGMENTIN) 500-125 MG tablet; Take 1 tablet by mouth in the morning and at bedtime.  Dispense: 10 tablet; Refill: 0  Allergic conjunctivitis of both  eyes - ketotifen (ZADITOR) 0.035 % ophthalmic solution; Place 1 drop into both eyes in the morning and at bedtime.  Dispense: 10 mL; Refill: 3   Return 4 months follow up, schedule for lab work.:

## 2023-11-10 NOTE — Progress Notes (Deleted)
Careteam: Patient Care Team: Venita Sheffield, MD as PCP - General (Internal Medicine)  PLACE OF SERVICE:  [facility]  Advanced Directive information    Allergies  Allergen Reactions   Colchicine     Upset stomach     Chief Complaint  Patient presents with   Medical Management of Chronic Issues    1 month follow-up   Immunizations    Shingrix     HPI: Patient is a 77 y.o. 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  HENT:  Positive for congestion and sinus pain. Negative for ear discharge, ear pain and sore throat.   Eyes:  Negative for double vision.  Respiratory:  Negative for cough, sputum production and shortness of breath.   Cardiovascular:  Negative for chest pain, palpitations and leg swelling.  Gastrointestinal:  Negative for abdominal pain, heartburn and nausea.  Genitourinary:  Negative for dysuria, frequency and hematuria.  Musculoskeletal:  Negative for falls and myalgias.  Neurological:  Negative for dizziness, sensory change and focal weakness.   Negative unless indicated in HPI.   Past Medical History:  Diagnosis Date   CKD (chronic kidney disease), stage III (HCC)    HTN (hypertension)    No past surgical history on file. Social History:   reports that he has never smoked. He has never been exposed to tobacco smoke. He has never used smokeless tobacco. He reports that he does not currently use  alcohol. He reports that he does not use drugs.  Family History  Problem Relation Age of Onset   Stroke Mother 20   Bronchiolitis Father 77    Medications: Patient's Medications  New Prescriptions   No medications on file  Previous Medications   ALBUTEROL (VENTOLIN HFA) 108 (90 BASE) MCG/ACT INHALER    Inhale 2 puffs into the lungs every 6 (six) hours as needed for wheezing or shortness of breath.   ALLOPURINOL (ZYLOPRIM) 100 MG TABLET    TAKE 1 TABLET BY MOUTH DAILY   ARTIFICIAL TEARS (LACRILUBE) OINT OPHTHALMIC OINTMENT    Place into both eyes every 3 (three) hours as needed for dry eyes.   AZELASTINE HCL 137 MCG/SPRAY SOLN    Place 2 sprays into both nostrils daily.   CETIRIZINE (ZYRTEC) 10 MG TABLET    Take 1 tablet (10 mg total) by mouth daily.   DOXYCYCLINE (VIBRA-TABS) 100 MG TABLET    Take 1 tablet (100 mg total) by mouth 2 (two) times daily.   FLUTICASONE (FLONASE) 50 MCG/ACT NASAL SPRAY    Place 2 sprays into both nostrils daily.   IRBESARTAN (AVAPRO) 75 MG TABLET    Take 1 tablet (75 mg total) by mouth daily.   MONTELUKAST (SINGULAIR) 10 MG TABLET    Take 1 tablet (10 mg total) by mouth at bedtime.   PREDNISONE (DELTASONE) 20 MG TABLET  Take 2 tablets (40 mg total) by mouth daily with breakfast.   TAMSULOSIN (FLOMAX) 0.4 MG CAPS CAPSULE    Take 1 capsule (0.4 mg total) by mouth at bedtime.   VITAMIN D, ERGOCALCIFEROL, 50 MCG (2000 UT) CAPS    Take by mouth daily.  Modified Medications   No medications on file  Discontinued Medications   No medications on file    Physical Exam: There were no vitals filed for this visit. There is no height or weight on file to calculate BMI. BP Readings from Last 3 Encounters:  10/10/23 126/82  07/05/23 128/76  03/07/23 (!) 102/52   Wt Readings from Last 3 Encounters:  10/10/23 211 lb 3.2 oz (95.8 kg)  07/05/23 207 lb (93.9 kg)  03/07/23 207 lb 14.4 oz (94.3 kg)    Physical Exam Constitutional:      Appearance: Normal  appearance.  HENT:     Head: Normocephalic and atraumatic.     Ears:     Comments: Unable to examine ear, no ear speculum available at the facility     Nose:     Comments: Mild maxillary sinus tenderness    Mouth/Throat:     Pharynx: Posterior oropharyngeal erythema present.  Cardiovascular:     Rate and Rhythm: Normal rate and regular rhythm.     Pulses: Normal pulses.     Heart sounds: Normal heart sounds.  Pulmonary:     Effort: No respiratory distress.     Breath sounds: No stridor. No wheezing or rales.  Abdominal:     General: Bowel sounds are normal. There is no distension.     Palpations: Abdomen is soft.     Tenderness: There is no abdominal tenderness. There is no right CVA tenderness or guarding.  Musculoskeletal:        General: No swelling.  Neurological:     Mental Status: He is alert. Mental status is at baseline.     Sensory: No sensory deficit.     Motor: No weakness.     Labs reviewed: Basic Metabolic Panel: Recent Labs    01/09/23 0000  K 4.3  CREATININE 1.4*   Liver Function Tests: No results for input(s): "AST", "ALT", "ALKPHOS", "BILITOT", "PROT", "ALBUMIN" in the last 8760 hours. No results for input(s): "LIPASE", "AMYLASE" in the last 8760 hours. No results for input(s): "AMMONIA" in the last 8760 hours. CBC: Recent Labs    01/09/23 0000  HGB 16.5   Lipid Panel: Recent Labs    07/06/23 0700  CHOL 158  HDL 42  LDLCALC 91  TRIG 155*  CHOLHDL 3.8   TSH: No results for input(s): "TSH" in the last 8760 hours. A1C: No results found for: "HGBA1C"   Assessment/Plan ***  No follow-ups on file.:   Kristy Schomburg

## 2023-11-21 ENCOUNTER — Other Ambulatory Visit: Payer: Medicare Other

## 2023-11-21 DIAGNOSIS — I10 Essential (primary) hypertension: Secondary | ICD-10-CM | POA: Diagnosis not present

## 2023-11-21 LAB — BASIC METABOLIC PANEL WITH GFR
BUN/Creatinine Ratio: 15 (calc) (ref 6–22)
BUN: 21 mg/dL (ref 7–25)
CO2: 29 mmol/L (ref 20–32)
Calcium: 9.6 mg/dL (ref 8.6–10.3)
Chloride: 104 mmol/L (ref 98–110)
Creat: 1.43 mg/dL — ABNORMAL HIGH (ref 0.70–1.28)
Glucose, Bld: 94 mg/dL (ref 65–99)
Potassium: 4.3 mmol/L (ref 3.5–5.3)
Sodium: 142 mmol/L (ref 135–146)
eGFR: 50 mL/min/{1.73_m2} — ABNORMAL LOW (ref >=60)

## 2023-12-25 ENCOUNTER — Other Ambulatory Visit: Payer: Self-pay | Admitting: Sports Medicine

## 2023-12-25 ENCOUNTER — Other Ambulatory Visit: Payer: Self-pay | Admitting: Adult Health

## 2023-12-25 DIAGNOSIS — I10 Essential (primary) hypertension: Secondary | ICD-10-CM

## 2024-01-31 ENCOUNTER — Other Ambulatory Visit: Payer: Self-pay | Admitting: Sports Medicine

## 2024-01-31 DIAGNOSIS — R0981 Nasal congestion: Secondary | ICD-10-CM

## 2024-02-12 DIAGNOSIS — N1831 Chronic kidney disease, stage 3a: Secondary | ICD-10-CM | POA: Diagnosis not present

## 2024-02-20 DIAGNOSIS — M109 Gout, unspecified: Secondary | ICD-10-CM | POA: Diagnosis not present

## 2024-02-20 DIAGNOSIS — I129 Hypertensive chronic kidney disease with stage 1 through stage 4 chronic kidney disease, or unspecified chronic kidney disease: Secondary | ICD-10-CM | POA: Diagnosis not present

## 2024-02-20 DIAGNOSIS — N1831 Chronic kidney disease, stage 3a: Secondary | ICD-10-CM | POA: Diagnosis not present

## 2024-02-20 DIAGNOSIS — R809 Proteinuria, unspecified: Secondary | ICD-10-CM | POA: Diagnosis not present

## 2024-02-20 DIAGNOSIS — N400 Enlarged prostate without lower urinary tract symptoms: Secondary | ICD-10-CM | POA: Diagnosis not present

## 2024-03-08 ENCOUNTER — Encounter: Payer: Self-pay | Admitting: Sports Medicine

## 2024-03-08 ENCOUNTER — Non-Acute Institutional Stay: Payer: Medicare Other | Admitting: Sports Medicine

## 2024-03-08 VITALS — BP 132/78 | HR 95 | Temp 96.1°F | Resp 18 | Ht 68.5 in | Wt 217.2 lb

## 2024-03-08 DIAGNOSIS — R0981 Nasal congestion: Secondary | ICD-10-CM | POA: Diagnosis not present

## 2024-03-08 DIAGNOSIS — H1013 Acute atopic conjunctivitis, bilateral: Secondary | ICD-10-CM | POA: Diagnosis not present

## 2024-03-08 DIAGNOSIS — H1031 Unspecified acute conjunctivitis, right eye: Secondary | ICD-10-CM | POA: Diagnosis not present

## 2024-03-08 DIAGNOSIS — N1831 Chronic kidney disease, stage 3a: Secondary | ICD-10-CM | POA: Diagnosis not present

## 2024-03-08 MED ORDER — MONTELUKAST SODIUM 10 MG PO TABS: 10.0000 mg | ORAL_TABLET | Freq: Every day | ORAL | 3 refills | Status: AC

## 2024-03-08 MED ORDER — BACITRACIN-POLYMYXIN B 500-10000 UNIT/GM OP OINT: 1.0000 | TOPICAL_OINTMENT | Freq: Two times a day (BID) | OPHTHALMIC | 0 refills | Status: DC

## 2024-03-08 MED ORDER — KETOTIFEN FUMARATE 0.035 % OP SOLN: 1.0000 [drp] | Freq: Two times a day (BID) | OPHTHALMIC | 3 refills | Status: AC

## 2024-03-08 NOTE — Progress Notes (Signed)
 Careteam: Patient Care Team: Venita Sheffield, MD as PCP - General (Internal Medicine)  PLACE OF SERVICE:  [facility] CLINIC  Advanced Directive information Does Patient Have a Medical Advance Directive?: No, Would patient like information on creating a medical advance directive?: No - Patient declined  Allergies  Allergen Reactions   Colchicine     Upset stomach     Chief Complaint  Patient presents with   Medical Management of Chronic Issues    4 Month Follow-up/needs to Covid, Pneumonia, and Shingrix vaccine      Discussed the use of AI scribe software for clinical note transcription with the patient, who gave verbal consent to proceed.  History of Present Illness  Andre [name] is a 78 year old male who presents with persistent eye symptoms.  He has been experiencing persistent eye symptoms, primarily affecting his right eye, which has been red and itchy for the past couple of weeks. There is watering and crusting noted in the mornings. He has been using artificial tears but has not used any other eye-specific treatments recently. He recalls using an ointment previously for an eye infection. No yellow discharge is present, only clear discharge, and there is no blurring or double vision.  He has ongoing issues with allergies and is currently taking montelukast, which he feels helps with his nasal symptoms. He also uses a nasal rinse, which is running clear, and takes Zyrtec daily for allergies. Nasal sprays are used in the morning and at night. No history of asthma or COPD and no recent wheezing or shortness of breath.    He has a history of chronic kidney disease, with recent lab tests showing stable results. He follows up with a kidney specialist annually. No symptoms such as dizziness, lightheadedness, or shortness of breath. He takes Evapro for blood pressure and reports his blood pressure as stable.  He engages in physical activity, walking a couple of times a week for  about four miles, and denies any exercise-induced symptoms. No recent falls or feelings of depression or anxiety. No chest pain, shortness of breath, or gastrointestinal symptoms such as acid reflux or nausea. He reports waking once at night to urinate but denies any pain or blood in his urine or stool. He takes a medication for his prostate in the evening.    Review of Systems:  Review of Systems  Constitutional:  Negative for chills and fever.  HENT:  Negative for congestion and sore throat.   Eyes:  Positive for discharge and redness. Negative for blurred vision, double vision and pain.  Respiratory:  Negative for cough, sputum production and shortness of breath.   Cardiovascular:  Negative for chest pain, palpitations and leg swelling.  Gastrointestinal:  Negative for abdominal pain, heartburn and nausea.  Genitourinary:  Negative for dysuria, frequency and hematuria.  Musculoskeletal:  Negative for falls and myalgias.  Neurological:  Negative for dizziness, sensory change and focal weakness.   Negative unless indicated in HPI.   Past Medical History:  Diagnosis Date   CKD (chronic kidney disease), stage III (HCC)    HTN (hypertension)    History reviewed. No pertinent surgical history. Social History:   reports that he has never smoked. He has never been exposed to tobacco smoke. He has never used smokeless tobacco. He reports that he does not currently use alcohol. He reports that he does not use drugs.  Family History  Problem Relation Age of Onset   Stroke Mother 65   Bronchiolitis Father 58  Medications: Patient's Medications  New Prescriptions   BACITRACIN-POLYMYXIN B (POLYSPORIN) OPHTHALMIC OINTMENT    Place 1 Application into the right eye every 12 (twelve) hours. apply to eye every 12 hours while awake  Previous Medications   ALLOPURINOL (ZYLOPRIM) 100 MG TABLET    TAKE 1 TABLET BY MOUTH DAILY   ARTIFICIAL TEARS (LACRILUBE) OINT OPHTHALMIC OINTMENT    Place into  both eyes every 3 (three) hours as needed for dry eyes.   AZELASTINE HCL 137 MCG/SPRAY SOLN    Place 2 sprays into both nostrils daily.   CETIRIZINE (ZYRTEC) 10 MG TABLET    Take 1 tablet (10 mg total) by mouth daily.   FLUTICASONE (FLONASE) 50 MCG/ACT NASAL SPRAY    Place 2 sprays into both nostrils daily.   IRBESARTAN (AVAPRO) 75 MG TABLET    TAKE 1 TABLET BY MOUTH DAILY   TAMSULOSIN (FLOMAX) 0.4 MG CAPS CAPSULE    TAKE 1 CAPSULE BY MOUTH AT  BEDTIME   VITAMIN D, ERGOCALCIFEROL, 50 MCG (2000 UT) CAPS    Take by mouth daily.  Modified Medications   Modified Medication Previous Medication   KETOTIFEN (ZADITOR) 0.035 % OPHTHALMIC SOLUTION ketotifen (ZADITOR) 0.035 % ophthalmic solution      Place 1 drop into both eyes in the morning and at bedtime.    Place 1 drop into both eyes in the morning and at bedtime.   MONTELUKAST (SINGULAIR) 10 MG TABLET montelukast (SINGULAIR) 10 MG tablet      Take 1 tablet (10 mg total) by mouth at bedtime.    TAKE 1 TABLET BY MOUTH EVERYDAY AT BEDTIME  Discontinued Medications   ALBUTEROL (VENTOLIN HFA) 108 (90 BASE) MCG/ACT INHALER    Inhale 2 puffs into the lungs every 6 (six) hours as needed for wheezing or shortness of breath.   AMOXICILLIN-CLAVULANATE (AUGMENTIN) 500-125 MG TABLET    Take 1 tablet by mouth in the morning and at bedtime.    Physical Exam: Vitals:   03/08/24 0826  BP: 132/78  Pulse: 95  Resp: 18  Temp: (!) 96.1 F (35.6 C)  SpO2: 96%  Weight: 217 lb 3.2 oz (98.5 kg)  Height: 5' 8.5" (1.74 m)   Body mass index is 32.54 kg/m. BP Readings from Last 3 Encounters:  03/08/24 132/78  11/10/23 124/78  10/10/23 126/82   Wt Readings from Last 3 Encounters:  03/08/24 217 lb 3.2 oz (98.5 kg)  11/10/23 215 lb 3.2 oz (97.6 kg)  10/10/23 211 lb 3.2 oz (95.8 kg)    Physical Exam Constitutional:      Appearance: Normal appearance.  HENT:     Head: Normocephalic and atraumatic.  Eyes:     General:        Right eye: Discharge present.   Cardiovascular:     Rate and Rhythm: Normal rate and regular rhythm.     Pulses: Normal pulses.     Heart sounds: Normal heart sounds.  Pulmonary:     Effort: No respiratory distress.     Breath sounds: No stridor. No wheezing or rales.  Abdominal:     General: Bowel sounds are normal. There is no distension.     Palpations: Abdomen is soft.     Tenderness: There is no abdominal tenderness. There is no right CVA tenderness or guarding.  Musculoskeletal:        General: No swelling.  Neurological:     Mental Status: He is alert. Mental status is at baseline.     Motor:  No weakness.     Labs reviewed: Basic Metabolic Panel: Recent Labs    11/21/23 0713  NA 142  K 4.3  CL 104  CO2 29  GLUCOSE 94  BUN 21  CREATININE 1.43*  CALCIUM 9.6   Liver Function Tests: No results for input(s): "AST", "ALT", "ALKPHOS", "BILITOT", "PROT", "ALBUMIN" in the last 8760 hours. No results for input(s): "LIPASE", "AMYLASE" in the last 8760 hours. No results for input(s): "AMMONIA" in the last 8760 hours. CBC: No results for input(s): "WBC", "NEUTROABS", "HGB", "HCT", "MCV", "PLT" in the last 8760 hours. Lipid Panel: Recent Labs    07/06/23 0700  CHOL 158  HDL 42  LDLCALC 91  TRIG 155*  CHOLHDL 3.8   TSH: No results for input(s): "TSH" in the last 8760 hours. A1C: No results found for: "HGBA1C"  Assessment and Plan Assessment & Plan   1. Allergic conjunctivitis of both eyes Instructed to use zaditor for allergies - ketotifen (ZADITOR) 0.035 % ophthalmic solution; Place 1 drop into both eyes in the morning and at bedtime.  Dispense: 10 mL; Refill: 3  2. Nasal congestion Cont with singulair, flonase, zyrtec daily - montelukast (SINGULAIR) 10 MG tablet; Take 1 tablet (10 mg total) by mouth at bedtime.  Dispense: 90 tablet; Refill: 3  3. Acute bacterial conjunctivitis of right eye (Primary) Will send bacitracin ointment Informed patient to monitor for swelling of eye lid ,  blurring of vision  4. Stage 3a chronic kidney disease (HCC) Avoid nephrotoxic meds Needs records from nephrology  Other orders - bacitracin-polymyxin b (POLYSPORIN) ophthalmic ointment; Place 1 Application into the right eye every 12 (twelve) hours. apply to eye every 12 hours while awake  Dispense: 3.5 g; Refill: 0     Return in about 3 months (around 06/07/2024).:

## 2024-03-10 ENCOUNTER — Other Ambulatory Visit: Payer: Self-pay | Admitting: Family Medicine

## 2024-04-04 ENCOUNTER — Other Ambulatory Visit: Payer: Self-pay | Admitting: Allergy & Immunology

## 2024-06-09 ENCOUNTER — Other Ambulatory Visit: Payer: Self-pay | Admitting: Allergy & Immunology

## 2024-06-14 ENCOUNTER — Encounter: Payer: Self-pay | Admitting: Sports Medicine

## 2024-06-14 ENCOUNTER — Ambulatory Visit (INDEPENDENT_AMBULATORY_CARE_PROVIDER_SITE_OTHER): Admitting: Sports Medicine

## 2024-06-14 VITALS — BP 104/48 | HR 85 | Temp 97.7°F | Resp 14 | Ht 68.5 in | Wt 215.8 lb

## 2024-06-14 DIAGNOSIS — J302 Other seasonal allergic rhinitis: Secondary | ICD-10-CM

## 2024-06-14 DIAGNOSIS — I10 Essential (primary) hypertension: Secondary | ICD-10-CM

## 2024-06-14 DIAGNOSIS — L989 Disorder of the skin and subcutaneous tissue, unspecified: Secondary | ICD-10-CM | POA: Diagnosis not present

## 2024-06-14 DIAGNOSIS — N1831 Chronic kidney disease, stage 3a: Secondary | ICD-10-CM

## 2024-06-14 DIAGNOSIS — N400 Enlarged prostate without lower urinary tract symptoms: Secondary | ICD-10-CM

## 2024-06-14 MED ORDER — BACITRACIN-POLYMYXIN B 500-10000 UNIT/GM OP OINT: 1.0000 | TOPICAL_OINTMENT | Freq: Two times a day (BID) | OPHTHALMIC | 0 refills | Status: DC

## 2024-06-14 MED ORDER — AZELASTINE HCL 137 MCG/SPRAY NA SOLN: 2.0000 | Freq: Every day | NASAL | 11 refills | Status: AC

## 2024-06-14 MED ORDER — AZELASTINE HCL 137 MCG/SPRAY NA SOLN: 2.0000 | Freq: Every day | NASAL | 11 refills | Status: DC

## 2024-06-14 NOTE — Patient Instructions (Addendum)
  Please remember ALL appointments are at the [facility].   2. Please remember to get the following vaccines from your local pharmacy : Pneumococcal Vaccine , Zoster Vaccine , COVID-19 Vaccine

## 2024-06-14 NOTE — Progress Notes (Signed)
 SABRA Obey: Patient Care Team: Sherlynn Madden, MD as PCP - General (Internal Medicine)  PLACE OF SERVICE:  [facility]  Advanced Directive information    Allergies  Allergen Reactions   Colchicine     Upset stomach     Chief Complaint  Patient presents with   Medical Management of Chronic Issues    3 month follow up                         [facility]  Discussed the use of AI scribe software for clinical note transcription with the patient, who gave verbal consent to proceed.  History of Present Illness    Andre [name] is a 78 year old male who presents for a regular follow-up visit.  He experiences persistent allergy  symptoms, including postnasal drip, watery and itchy eyes, and nasal congestion, which have remained unchanged for over a year. He manages these symptoms with azelastine  nasal spray, Zyrtec , and Singulair  (montelukast ) and requests a renewal of his azelastine  prescription. No sore throat, sinus pain, dizziness, or lightheadedness.  He has a history of gout, currently managed with allopurinol , and has not experienced any recent flare-ups.  He reports that his nephrologist  and had blood work was done three months ago.  He experiences urinary frequency, waking once at night to urinate, but denies burning sensation or difficulty with urination. He takes Flomax .  He mentions a persistent scab from a spider bite over a year ago, described as dead skin or a callus, and has not seen a dermatologist for this issue.  He and his wife have been trying to walk daily, but the recent heat has been a barrier.   Review of Systems:  Review of Systems  Constitutional:  Negative for fever.  HENT:  Negative for sore throat.   Eyes:  Negative for double vision.  Respiratory:  Negative for cough, sputum production and shortness of breath.   Cardiovascular:  Negative for chest pain, palpitations and leg swelling.  Gastrointestinal:  Negative for abdominal pain,  heartburn and nausea.  Genitourinary:  Negative for dysuria, frequency and hematuria.  Musculoskeletal:  Negative for falls and myalgias.  Neurological:  Negative for dizziness.   Negative unless indicated in HPI.   Past Medical History:  Diagnosis Date   CKD (chronic kidney disease), stage III (HCC)    HTN (hypertension)    History reviewed. No pertinent surgical history. Social History:   reports that he has never smoked. He has never been exposed to tobacco smoke. He has never used smokeless tobacco. He reports that he does not currently use alcohol. He reports that he does not use drugs.  Family History  Problem Relation Age of Onset   Stroke Mother 30   Bronchiolitis Father 76    Medications: Patient's Medications  New Prescriptions   No medications on file  Previous Medications   ALLOPURINOL  (ZYLOPRIM ) 100 MG TABLET    TAKE 1 TABLET BY MOUTH DAILY   ARTIFICIAL TEARS (LACRILUBE) OINT OPHTHALMIC OINTMENT    Place into both eyes every 3 (three) hours as needed for dry eyes.   AZELASTINE  HCL 137 MCG/SPRAY SOLN    Place 2 sprays into both nostrils daily.   BACITRACIN -POLYMYXIN B  (POLYSPORIN ) OPHTHALMIC OINTMENT    Place 1 Application into the right eye every 12 (twelve) hours. apply to eye every 12 hours while awake   CETIRIZINE  (ZYRTEC ) 10 MG TABLET    Take 1 tablet (10 mg total) by  mouth daily.   FLUTICASONE  (FLONASE ) 50 MCG/ACT NASAL SPRAY    Place 2 sprays into both nostrils daily.   IRBESARTAN  (AVAPRO ) 75 MG TABLET    TAKE 1 TABLET BY MOUTH DAILY   KETOTIFEN  (ZADITOR ) 0.035 % OPHTHALMIC SOLUTION    Place 1 drop into both eyes in the morning and at bedtime.   MONTELUKAST  (SINGULAIR ) 10 MG TABLET    Take 1 tablet (10 mg total) by mouth at bedtime.   TAMSULOSIN  (FLOMAX ) 0.4 MG CAPS CAPSULE    TAKE 1 CAPSULE BY MOUTH AT  BEDTIME   VITAMIN D , ERGOCALCIFEROL , 50 MCG (2000 UT) CAPS    Take by mouth daily.  Modified Medications   No medications on file  Discontinued Medications    No medications on file    Physical Exam: Vitals:   06/14/24 0809  BP: (!) 104/48  Pulse: 85  Resp: 14  Temp: 97.7 F (36.5 C)  SpO2: 94%  Weight: 215 lb 12.8 oz (97.9 kg)  Height: 5' 8.5 (1.74 m)   Body mass index is 32.34 kg/m. BP Readings from Last 3 Encounters:  06/14/24 (!) 104/48  03/08/24 132/78  11/10/23 124/78   Wt Readings from Last 3 Encounters:  06/14/24 215 lb 12.8 oz (97.9 kg)  03/08/24 217 lb 3.2 oz (98.5 kg)  11/10/23 215 lb 3.2 oz (97.6 kg)    Physical Exam Constitutional:      Appearance: Normal appearance.  HENT:     Head: Normocephalic and atraumatic.  Cardiovascular:     Rate and Rhythm: Normal rate and regular rhythm.     Pulses: Normal pulses.     Heart sounds: Normal heart sounds.  Pulmonary:     Effort: No respiratory distress.     Breath sounds: No stridor. No wheezing or rales.  Abdominal:     General: Bowel sounds are normal. There is no distension.     Palpations: Abdomen is soft.     Tenderness: There is no abdominal tenderness. There is no guarding.  Musculoskeletal:        General: No swelling.  Neurological:     Mental Status: He is alert. Mental status is at baseline.     Motor: No weakness.     Labs reviewed: Basic Metabolic Panel: Recent Labs    11/21/23 0713  NA 142  K 4.3  CL 104  CO2 29  GLUCOSE 94  BUN 21  CREATININE 1.43*  CALCIUM 9.6   Liver Function Tests: No results for input(s): AST, ALT, ALKPHOS, BILITOT, PROT, ALBUMIN in the last 8760 hours. No results for input(s): LIPASE, AMYLASE in the last 8760 hours. No results for input(s): AMMONIA in the last 8760 hours. CBC: No results for input(s): WBC, NEUTROABS, HGB, HCT, MCV, PLT in the last 8760 hours. Lipid Panel: Recent Labs    07/06/23 0700  CHOL 158  HDL 42  LDLCALC 91  TRIG 155*  CHOLHDL 3.8   TSH: No results for input(s): TSH in the last 8760 hours. A1C: No results found for: HGBA1C  Assessment  and Plan Assessment & Plan   1. Primary hypertension (Primary) Denies dizziness Cont with irbesartan  '  2. Stage 3a chronic kidney disease (HCC) Pt follows with nephrology Need records   3. Seasonal allergies Refilled azelastine  Cont with zyrtec , singulair   4. Benign prostatic hyperplasia without lower urinary tract symptoms Cont with flomax   5. Skin lesion  - Ambulatory referral to Dermatology  Other orders - bacitracin -polymyxin b  (POLYSPORIN ) ophthalmic ointment; Place 1  Application into the right eye every 12 (twelve) hours. apply to eye every 12 hours while awake  Dispense: 3.5 g; Refill: 0 - Azelastine  HCl 137 MCG/SPRAY SOLN; Place 2 sprays into both nostrils daily.  Dispense: 30 mL; Refill: 11

## 2024-07-21 ENCOUNTER — Other Ambulatory Visit: Payer: Self-pay | Admitting: Sports Medicine

## 2024-08-18 DIAGNOSIS — Z23 Encounter for immunization: Secondary | ICD-10-CM | POA: Diagnosis not present

## 2024-08-24 ENCOUNTER — Encounter: Payer: Self-pay | Admitting: Sports Medicine

## 2024-08-26 NOTE — Telephone Encounter (Signed)
Message routed to PCP Venita Sheffield, MD

## 2024-08-27 DIAGNOSIS — Z86018 Personal history of other benign neoplasm: Secondary | ICD-10-CM | POA: Diagnosis not present

## 2024-08-27 DIAGNOSIS — I10 Essential (primary) hypertension: Secondary | ICD-10-CM | POA: Diagnosis not present

## 2024-08-27 DIAGNOSIS — R197 Diarrhea, unspecified: Secondary | ICD-10-CM | POA: Diagnosis not present

## 2024-09-13 ENCOUNTER — Encounter: Admitting: Sports Medicine

## 2024-09-16 DIAGNOSIS — Z23 Encounter for immunization: Secondary | ICD-10-CM | POA: Diagnosis not present

## 2024-09-17 NOTE — Progress Notes (Signed)
 This encounter was created in error - please disregard.

## 2024-11-12 ENCOUNTER — Other Ambulatory Visit: Payer: Self-pay | Admitting: Sports Medicine

## 2024-11-12 DIAGNOSIS — I10 Essential (primary) hypertension: Secondary | ICD-10-CM

## 2024-11-15 ENCOUNTER — Ambulatory Visit: Admitting: Adult Health

## 2024-11-15 ENCOUNTER — Encounter: Payer: Self-pay | Admitting: Adult Health

## 2024-11-15 VITALS — BP 110/70 | HR 97 | Temp 97.6°F | Ht 68.5 in

## 2024-11-15 DIAGNOSIS — H1013 Acute atopic conjunctivitis, bilateral: Secondary | ICD-10-CM | POA: Diagnosis not present

## 2024-11-15 DIAGNOSIS — I10 Essential (primary) hypertension: Secondary | ICD-10-CM | POA: Diagnosis not present

## 2024-11-15 DIAGNOSIS — J028 Acute pharyngitis due to other specified organisms: Secondary | ICD-10-CM | POA: Diagnosis not present

## 2024-11-15 DIAGNOSIS — B9689 Other specified bacterial agents as the cause of diseases classified elsewhere: Secondary | ICD-10-CM

## 2024-11-15 MED ORDER — OLOPATADINE HCL 0.2 % OP SOLN: 1.0000 [drp] | Freq: Every day | OPHTHALMIC | 0 refills | Status: AC | PRN

## 2024-11-15 MED ORDER — AMOXICILLIN 500 MG PO CAPS: 500.0000 mg | ORAL_CAPSULE | Freq: Two times a day (BID) | ORAL | 0 refills | Status: AC

## 2024-11-15 NOTE — Progress Notes (Signed)
 [facility] clinic  Provider:  Jereld Serum DNP  Code Status:   Goals of Care:     03/08/2024    8:55 AM  Advanced Directives  Does Patient Have a Medical Advance Directive? No  Would patient like information on creating a medical advance directive? No - Patient declined     Chief Complaint  Patient presents with   Sinusitis    Fall allergies and pt has itchy eyes and pain on left side of face. Foggy H/A   Discussed the use of AI scribe software for clinical note transcription with the patient, who gave verbal consent to proceed.   HPI: Patient is a 78 y.o. male seen today for an acute visit for sinusitis.  He has been experiencing sinus congestion for the past couple of months. He describes a sensation of something moving from his ear down to his jaw, primarily on the left side. He experiences a 'foggy headache' and congestion that worsens at night, leading to a plugged nose and throat upon waking. No fever is present.  He has a history of allergies, particularly exacerbated since moving to [place] , with symptoms triggered by outdoor allergens such as leaves and weeds. His eyes are itchy, and he has developed redness and bumps above one eye due to rubbing. He uses azelastine  nasal spray in the morning and fluticasone  nasal spray at night, which had been effective until the last two to three months. A saline solution provides some relief for nasal congestion.  He has been taking Mucinex, primarily during the day, but questions its effectiveness as his main issue is congestion rather than coughing. The mucus is sometimes yellow and occasionally contains blood.  He reports a persistent low-grade sore throat, particularly in the morning, feeling like 'gravel' in his throat. No significant pain when swallowing, but soreness is noted at the back of his throat.  He has bilateral hearing aids and mentions difficulty hearing, especially when others are wearing masks.   He moved  to [place]  six years ago and resides in a cottage at [facility] in [place].     Past Medical History:  Diagnosis Date   CKD (chronic kidney disease), stage III (HCC)    HTN (hypertension)     History reviewed. No pertinent surgical history.  Allergies[1]  Outpatient Encounter Medications as of 11/15/2024  Medication Sig   allopurinol  (ZYLOPRIM ) 100 MG tablet TAKE 1 TABLET BY MOUTH DAILY   artificial tears (LACRILUBE) OINT ophthalmic ointment Place into both eyes every 3 (three) hours as needed for dry eyes.   Azelastine  HCl 137 MCG/SPRAY SOLN Place 2 sprays into both nostrils daily.   bacitracin -polymyxin b  (POLYSPORIN ) ophthalmic ointment Place 1 Application into the right eye every 12 (twelve) hours. apply to eye every 12 hours while awake (Patient taking differently: Place 1 Application into the right eye every 12 (twelve) hours. apply to eye every 12 hours while awake patient using every other day.)   cetirizine  (ZYRTEC ) 10 MG tablet Take 1 tablet (10 mg total) by mouth daily.   fluticasone  (FLONASE ) 50 MCG/ACT nasal spray Place 2 sprays into both nostrils daily.   irbesartan  (AVAPRO ) 75 MG tablet TAKE 1 TABLET BY MOUTH DAILY   ketotifen  (ZADITOR ) 0.035 % ophthalmic solution Place 1 drop into both eyes in the morning and at bedtime.   montelukast  (SINGULAIR ) 10 MG tablet Take 1 tablet (10 mg total) by mouth at bedtime.   tamsulosin  (FLOMAX ) 0.4 MG CAPS capsule TAKE  1 CAPSULE BY MOUTH AT  BEDTIME   Vitamin D , Ergocalciferol , 50 MCG (2000 UT) CAPS Take by mouth daily.   No facility-administered encounter medications on file as of 11/15/2024.    Review of Systems:  Review of Systems  Constitutional:  Negative for activity change, appetite change and fever.  HENT:  Positive for ear pain, postnasal drip, sinus pressure, sinus pain, sneezing and sore throat.   Eyes:  Positive for redness and itching.  Cardiovascular:  Negative for chest pain  and leg swelling.  Gastrointestinal:  Negative for abdominal distention, diarrhea and vomiting.  Genitourinary:  Negative for dysuria, frequency and urgency.  Skin:  Negative for color change.  Neurological:  Negative for dizziness and headaches.  Psychiatric/Behavioral:  Negative for behavioral problems and sleep disturbance. The patient is not nervous/anxious.     Health Maintenance  Topic Date Due   Zoster Vaccines- Shingrix (1 of 2) 03/07/1996   Pneumococcal Vaccine: 50+ Years (1 of 2 - PCV) 06/14/2025 (Originally 03/07/1965)   Medicare Annual Wellness (AWV)  04/02/2032 (Originally 18-Apr-1946)   COVID-19 Vaccine (9 - 2025-26 season) 02/15/2025   Influenza Vaccine  Completed   Hepatitis C Screening  Completed   Meningococcal B Vaccine  Aged Out   DTaP/Tdap/Td  Discontinued   Colonoscopy  Discontinued    Physical Exam: Vitals:   11/15/24 0937  BP: 110/70  Pulse: 97  Temp: 97.6 F (36.4 C)  SpO2: 100%  Height: 5' 8.5 (1.74 m)   Body mass index is 32.34 kg/m. Physical Exam Constitutional:      Appearance: He is obese.  HENT:     Head: Normocephalic and atraumatic.     Mouth/Throat:     Mouth: Mucous membranes are moist.  Eyes:     Conjunctiva/sclera: Conjunctivae normal.  Cardiovascular:     Rate and Rhythm: Normal rate and regular rhythm.     Pulses: Normal pulses.     Heart sounds: Normal heart sounds.  Pulmonary:     Effort: Pulmonary effort is normal.     Breath sounds: Normal breath sounds.  Abdominal:     General: Bowel sounds are normal.     Palpations: Abdomen is soft.  Musculoskeletal:        General: No swelling. Normal range of motion.     Cervical back: Normal range of motion.  Skin:    General: Skin is warm and dry.  Neurological:     General: No focal deficit present.     Mental Status: He is alert and oriented to person, place, and time.  Psychiatric:        Mood and Affect: Mood normal.        Behavior: Behavior normal.        Thought  Content: Thought content normal.        Judgment: Judgment normal.     Labs reviewed: Basic Metabolic Panel: Recent Labs    11/21/23 0713  NA 142  K 4.3  CL 104  CO2 29  GLUCOSE 94  BUN 21  CREATININE 1.43*  CALCIUM 9.6   Liver Function Tests: No results for input(s): AST, ALT, ALKPHOS, BILITOT, PROT, ALBUMIN in the last 8760 hours. No results for input(s): LIPASE, AMYLASE in the last 8760 hours. No results for input(s): AMMONIA in the last 8760 hours. CBC: No results for input(s): WBC, NEUTROABS, HGB, HCT, MCV, PLT in the last 8760 hours. Lipid Panel: No results for input(s): CHOL, HDL, LDLCALC, TRIG, CHOLHDL, LDLDIRECT in the last 8760 hours.  No results found for: HGBA1C  Procedures since last visit: No results found.  Assessment/Plan  1. Acute bacterial pharyngitis (Primary) -  Likely bacterial etiology due to chronic and severe symptoms without fever. - Prescribed amoxicillin  500 mg twice daily for 10 days. - Advised gargling with warm water and salt. - Continue using saline solution for nasal congestion. - amoxicillin  (AMOXIL ) 500 MG capsule; Take 1 capsule (500 mg total) by mouth 2 (two) times daily for 10 days.  Dispense: 20 capsule; Refill: 0  2. Allergic conjunctivitis of both eyes -  Symptoms likely due to environmental allergens, no infection present. - Prescribed eye drops for allergic conjunctivitis, one drop per eye daily as needed. - Advised against rubbing eyes and to wash hands before touching eyes. - Olopatadine  HCl 0.2 % SOLN; Apply 1 drop to eye daily as needed.  Dispense: 3.5 mL; Refill: 0  3. Benign essential HTN -  Blood pressure well-controlled at 110/70 mmHg on current medication. - Continue irbesartan  75 mg daily.      Labs/tests ordered:  None   Return if symptoms worsen or fail to improve.  Shaana Acocella Medina-Vargas, NP     [1]  Allergies Allergen Reactions   Colchicine     Upset  stomach

## 2024-11-19 ENCOUNTER — Telehealth: Payer: Self-pay

## 2024-11-19 DIAGNOSIS — H25813 Combined forms of age-related cataract, bilateral: Secondary | ICD-10-CM | POA: Diagnosis not present

## 2024-11-19 DIAGNOSIS — B0239 Other herpes zoster eye disease: Secondary | ICD-10-CM | POA: Diagnosis not present

## 2024-11-19 NOTE — Telephone Encounter (Signed)
 I am glad his eye is being taken cared of.

## 2024-11-19 NOTE — Telephone Encounter (Signed)
 Copied from CRM #[id number]. Topic: General - Other >> Nov 19, 2024  2:00 PM Miquel SAILOR wrote: Reason for CRM: PT was seen on 12/12 but he was seeing at his eye doctor and they diagnosed him with shingles in his RT eye. He is having headaches but not bad just fyi. If you need to reach out [phone] >> Nov 19, 2024  2:05 PM Miquel SAILOR wrote: He is is doing treatment for this issue with eye PCP

## 2024-12-22 ENCOUNTER — Other Ambulatory Visit: Payer: Self-pay | Admitting: Sports Medicine
